# Patient Record
Sex: Male | Born: 1955 | Race: White | Hispanic: No | Marital: Single | Smoking: Former smoker
Health system: Southern US, Community
[De-identification: ages and names within clinical notes are randomized; demographics above are authoritative.]

## PROBLEM LIST (undated history)

## (undated) DIAGNOSIS — I2699 Other pulmonary embolism without acute cor pulmonale: Secondary | ICD-10-CM

## (undated) DIAGNOSIS — J189 Pneumonia, unspecified organism: Secondary | ICD-10-CM

## (undated) DIAGNOSIS — I10 Essential (primary) hypertension: Secondary | ICD-10-CM

## (undated) DIAGNOSIS — J449 Chronic obstructive pulmonary disease, unspecified: Secondary | ICD-10-CM

## (undated) DIAGNOSIS — I509 Heart failure, unspecified: Secondary | ICD-10-CM

## (undated) HISTORY — DX: Chronic obstructive pulmonary disease, unspecified: J44.9

## (undated) HISTORY — DX: Heart failure, unspecified: I50.9

## (undated) HISTORY — DX: Other pulmonary embolism without acute cor pulmonale: I26.99

---
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
## 2011-03-02 DIAGNOSIS — Z87891 Personal history of nicotine dependence: Secondary | ICD-10-CM | POA: Insufficient documentation

## 2018-04-04 ENCOUNTER — Encounter: Payer: Self-pay | Admitting: Emergency Medicine

## 2018-04-04 ENCOUNTER — Emergency Department (INDEPENDENT_AMBULATORY_CARE_PROVIDER_SITE_OTHER)
Admission: EM | Admit: 2018-04-04 | Discharge: 2018-04-04 | Disposition: A | Discharge status: Home / Self Care | Payer: Self-pay | Source: Home / Self Care | Attending: Family Medicine | Admitting: Family Medicine

## 2018-04-04 ENCOUNTER — Emergency Department (INDEPENDENT_AMBULATORY_CARE_PROVIDER_SITE_OTHER): Payer: Self-pay

## 2018-04-04 DIAGNOSIS — J189 Pneumonia, unspecified organism: Secondary | ICD-10-CM

## 2018-04-04 DIAGNOSIS — R918 Other nonspecific abnormal finding of lung field: Secondary | ICD-10-CM

## 2018-04-04 DIAGNOSIS — J181 Lobar pneumonia, unspecified organism: Secondary | ICD-10-CM

## 2018-04-04 HISTORY — DX: Essential (primary) hypertension: I10

## 2018-04-04 MED ORDER — IPRATROPIUM-ALBUTEROL 0.5-2.5 (3) MG/3ML IN SOLN: 3.0000 mL | Freq: Once | RESPIRATORY_TRACT | Status: DC

## 2018-04-04 MED ORDER — METHYLPREDNISOLONE SODIUM SUCC 40 MG IJ SOLR
80.0000 mg | Freq: Once | INTRAMUSCULAR | Status: AC
  Administered 2018-04-04: 80 mg via INTRAMUSCULAR

## 2018-04-04 MED ORDER — PREDNISONE 20 MG PO TABS: ORAL_TABLET | ORAL | 0 refills | Status: DC

## 2018-04-04 MED ORDER — CEFTRIAXONE SODIUM 1 G IJ SOLR
1.0000 g | Freq: Once | INTRAMUSCULAR | Status: AC
  Administered 2018-04-04: 1 g via INTRAMUSCULAR

## 2018-04-04 MED ORDER — LEVOFLOXACIN 500 MG PO TABS: 500.0000 mg | ORAL_TABLET | Freq: Every day | ORAL | 0 refills | Status: DC

## 2018-04-04 NOTE — Discharge Instructions (Signed)
°  Please take antibiotics as prescribed and be sure to complete entire course even if you start to feel better to ensure infection does not come back.  Please follow up with family medicine in 1 week if not improving, or call 911/go to the hospital if symptoms worsening including shortness of breath.  Please follow up with primary care on 04/15/18 as previously scheduled. It is recommended you get a repeat chest x-ray in 3-4 weeks to make sure the lung findings have cleared. There is some concern of possible lung mass/malignancy/cancer.

## 2018-04-04 NOTE — ED Provider Notes (Signed)
[facility]    CSN: [id number] Arrival date & time: 04/04/18  0941     History   Chief Complaint Chief Complaint  Patient presents with  . Cough    HPI Ronnie Bowman is a 62 y.o. male.   HPI Ronnie Bowman is a 62 y.o. male presenting to UC with c/o 1-2 weeks of worsening cough, congestion, chest tightness. He has taken 4 days of doxycycline, which were prescribed to his girlfriend last week who was started on Levaquin after the doxycycline was not helping her symptoms. Pt denies relief with the doxycycline.  He reports smoking for several years but quit about 6 months ago. He had been doing well and no SOB until last week.  Hx of possible COPD and CHF. He has been hospitalized before for breathing trouble but not for pneumonia.  He has inhalers at home but they do not provider much relief and he tries not to use them because he fears he will become dependant on them. Denies fever, chills, nvd.  BP elevated. Hx of HTN. He has been off his BP medication for a few weeks. He has a new patient appointment on 04/15/18.   Past Medical History:  Diagnosis Date  . Hypertension     There are no active problems to display for this patient.   History reviewed. No pertinent surgical history.     Home Medications    Prior to Admission medications   Medication Sig Start Date End Date Taking? Authorizing Provider  levofloxacin (LEVAQUIN) 500 MG tablet Take 1 tablet (500 mg total) by mouth daily. 04/04/18   Lurene Shadow, PA-C  predniSONE (DELTASONE) 20 MG tablet 3 tabs po day one, then 2 po daily x 4 days 04/04/18   Lurene Shadow, PA-C    Family History History reviewed. No pertinent family history.  Social History Social History   Tobacco Use  . Smoking status: Former Games developer  . Smokeless tobacco: Never Used  Substance Use Topics  . Alcohol use: Yes  . Drug use: Not on file     Allergies   Patient has no allergy information on record.   Review of Systems Review  of Systems  Constitutional: Positive for fatigue. Negative for chills and fever.  HENT: Positive for congestion. Negative for ear pain, sore throat, trouble swallowing and voice change.   Respiratory: Positive for cough, chest tightness, shortness of breath and wheezing.   Cardiovascular: Negative for chest pain and palpitations.  Gastrointestinal: Negative for abdominal pain, diarrhea, nausea and vomiting.  Musculoskeletal: Negative for arthralgias, back pain and myalgias.  Skin: Negative for rash.     Physical Exam Triage Vital Signs ED Triage Vitals  Enc Vitals Group     BP 04/04/18 1028 (!) 177/94     Pulse Rate 04/04/18 1028 60     Resp --      Temp 04/04/18 1028 97.8 F (36.6 C)     Temp Source 04/04/18 1028 Oral     SpO2 04/04/18 1028 92 %     Weight 04/04/18 1029 170 lb (77.1 kg)     Height --      Head Circumference --      Peak Flow --      Pain Score 04/04/18 1029 0     Pain Loc --      Pain Edu? --      Excl. in GC? --    No data found.  Updated Vital Signs BP (!) 177/94 (BP  Location: Right Arm)   Pulse 60   Temp 97.8 F (36.6 C) (Oral)   Wt 170 lb (77.1 kg)   SpO2 92%   Visual Acuity Right Eye Distance:   Left Eye Distance:   Bilateral Distance:    Right Eye Near:   Left Eye Near:    Bilateral Near:     Physical Exam  Constitutional: He is oriented to person, place, and time. He appears well-developed and well-nourished. No distress.  HENT:  Head: Normocephalic and atraumatic.  Right Ear: Tympanic membrane normal.  Left Ear: Tympanic membrane normal.  Nose: Nose normal. Right sinus exhibits no maxillary sinus tenderness and no frontal sinus tenderness. Left sinus exhibits no maxillary sinus tenderness and no frontal sinus tenderness.  Mouth/Throat: Uvula is midline, oropharynx is clear and moist and mucous membranes are normal.  Eyes: EOM are normal.  Neck: Normal range of motion. Neck supple.  Cardiovascular: Normal rate and regular rhythm.    Pulmonary/Chest: Effort normal. He has decreased breath sounds in the right middle field, the right lower field, the left middle field and the left lower field. He has wheezes. He has rhonchi. He has no rales.  Musculoskeletal: Normal range of motion.  Neurological: He is alert and oriented to person, place, and time.  Skin: Skin is warm and dry. He is not diaphoretic.  Psychiatric: He has a normal mood and affect. His behavior is normal.  Nursing note and vitals reviewed.    UC Treatments / Results  Labs (all labs ordered are listed, but only abnormal results are displayed) Labs Reviewed - No data to display  EKG None  Radiology Dg Chest 2 View  Result Date: 04/04/2018 CLINICAL DATA:  Patient with productive cough. EXAM: CHEST - 2 VIEW COMPARISON:  None. FINDINGS: Large area of consolidation within the left mid lung. Normal cardiac and mediastinal contours. No pleural effusion or pneumothorax. Thoracic spine degenerative changes. IMPRESSION: Large area of consolidation within the left mid lung may represent pneumonia in the appropriate clinical setting. Possibility of pulmonary mass is not excluded. Followup PA and lateral chest X-ray is recommended in 3-4 weeks following trial of antibiotic therapy to ensure resolution and exclude underlying malignancy. Electronically Signed   By: Annia Belt M.D.   On: 04/04/2018 10:56    Procedures Procedures (including critical care time)  Medications Ordered in UC Medications  ipratropium-albuterol (DUONEB) 0.5-2.5 (3) MG/3ML nebulizer solution 3 mL (has no administration in time range)  cefTRIAXone (ROCEPHIN) injection 1 g (1 g Intramuscular Given 04/04/18 1129)  methylPREDNISolone sodium succinate (SOLU-MEDROL) 40 mg/mL injection 80 mg (80 mg Intramuscular Given 04/04/18 1129)    Initial Impression / Assessment and Plan / UC Course  I have reviewed the triage vital signs and the nursing notes.  Pertinent labs & imaging results that were  available during my care of the patient were reviewed by me and considered in my medical decision making (see chart for details).     Diffuse wheeze and decreased lung sounds. CXR c/w pneumonia. Recommend f/u in 2 weeks for new pt appointment but also f/u in 3-4 weeks for repeat CXR to ensure clearance of lung findings after antibiotic treatment. Pt agreeable.  Final Clinical Impressions(s) / UC Diagnoses   Final diagnoses:  Community acquired pneumonia of left upper lobe of lung (HCC)     Discharge Instructions      Please take antibiotics as prescribed and be sure to complete entire course even if you start to feel better to  ensure infection does not come back.  Please follow up with family medicine in 1 week if not improving, or call 911/go to the hospital if symptoms worsening including shortness of breath.  Please follow up with primary care on 04/15/18 as previously scheduled. It is recommended you get a repeat chest x-ray in 3-4 weeks to make sure the lung findings have cleared. There is some concern of possible lung mass/malignancy/cancer.     ED Prescriptions    Medication Sig Dispense Auth. Provider   predniSONE (DELTASONE) 20 MG tablet 3 tabs po day one, then 2 po daily x 4 days 11 tablet Jeryl Wilbourn O, PA-C   levofloxacin (LEVAQUIN) 500 MG tablet Take 1 tablet (500 mg total) by mouth daily. 7 tablet Lurene Shadow, PA-C     Controlled Substance Prescriptions [place] Controlled Substance Registry consulted? Not Applicable   Rolla Plate 04/04/18 1209

## 2018-04-04 NOTE — ED Triage Notes (Signed)
Pt c/o head congestion and cough x1 week. States he has taken some of his girlfriends abx. He has been out of BP meds for a few weeks but states he has appt with pcp on 04/15/18.

## 2018-04-15 ENCOUNTER — Encounter: Payer: Self-pay | Admitting: Physician Assistant

## 2018-04-15 ENCOUNTER — Ambulatory Visit (INDEPENDENT_AMBULATORY_CARE_PROVIDER_SITE_OTHER): Payer: Self-pay | Admitting: Physician Assistant

## 2018-04-15 VITALS — BP 146/88 | HR 60 | Wt 167.0 lb

## 2018-04-15 DIAGNOSIS — Z7689 Persons encountering health services in other specified circumstances: Secondary | ICD-10-CM

## 2018-04-15 DIAGNOSIS — J449 Chronic obstructive pulmonary disease, unspecified: Secondary | ICD-10-CM | POA: Insufficient documentation

## 2018-04-15 DIAGNOSIS — I10 Essential (primary) hypertension: Secondary | ICD-10-CM

## 2018-04-15 DIAGNOSIS — I509 Heart failure, unspecified: Secondary | ICD-10-CM

## 2018-04-15 DIAGNOSIS — F5101 Primary insomnia: Secondary | ICD-10-CM | POA: Insufficient documentation

## 2018-04-15 DIAGNOSIS — Z86711 Personal history of pulmonary embolism: Secondary | ICD-10-CM

## 2018-04-15 DIAGNOSIS — J189 Pneumonia, unspecified organism: Secondary | ICD-10-CM

## 2018-04-15 DIAGNOSIS — Z7901 Long term (current) use of anticoagulants: Secondary | ICD-10-CM | POA: Insufficient documentation

## 2018-04-15 HISTORY — DX: Pneumonia, unspecified organism: J18.9

## 2018-04-15 MED ORDER — LISINOPRIL 20 MG PO TABS: 20.0000 mg | ORAL_TABLET | Freq: Every day | ORAL | 0 refills | Status: DC

## 2018-04-15 MED ORDER — CARVEDILOL 25 MG PO TABS: 25.0000 mg | ORAL_TABLET | Freq: Two times a day (BID) | ORAL | 0 refills | Status: DC

## 2018-04-15 MED ORDER — WARFARIN SODIUM 5 MG PO TABS: 5.0000 mg | ORAL_TABLET | Freq: Every day | ORAL | 0 refills | Status: DC

## 2018-04-15 MED ORDER — HYDROXYZINE HCL 50 MG PO TABS: 50.0000 mg | ORAL_TABLET | Freq: Every evening | ORAL | 0 refills | Status: DC | PRN

## 2018-04-15 NOTE — Patient Instructions (Addendum)
For your blood pressure: - Goal <130/80 - monitor and log blood pressures at home - check around the same time each day in a relaxed setting - Limit salt to <2000 mg/day - Follow DASH eating plan - limit alcohol to 2 standard drinks per day for men and 1 per day for women - avoid tobacco products - weight loss: 7% of current body weight - follow-up every 6 months for your blood pressure    Sleep Hygiene . Limiting daytime naps to 30 minutes . Napping does not make up for inadequate nighttime sleep. However, a short nap of 20-30 minutes can help to improve mood, alertness and performance.  . Avoiding stimulants such as  caffeine and nicotine close to bedtime.  And when it comes to alcohol, moderation is key 4. While alcohol is well-known to help you fall asleep faster, too much close to bedtime can disrupt sleep in the second half of the night as the body begins to process the alcohol.    . Exercising to promote good quality sleep.  As little as 10 minutes of aerobic exercise, such as walking or cycling, can drastically improve nighttime sleep quality.  For the best night's sleep, most people should avoid strenuous workouts close to bedtime. However, the effect of intense nighttime exercise on sleep differs from person to person, so find out what works best for you.   . Steering clear of food that can be disruptive right before sleep.   Heavy or rich foods, fatty or fried meals, spicy dishes, citrus fruits, and carbonated drinks can trigger indigestion for some people. When this occurs close to bedtime, it can lead to painful heartburn that disrupts sleep. . Ensuring adequate exposure to natural light.  This is particularly important for individuals who may not venture outside frequently. Exposure to sunlight during the day, as well as darkness at night, helps to maintain a healthy sleep-wake cycle . Marland Kitchen. Establishing a regular relaxing bedtime routine.  A regular nightly routine helps the body  recognize that it is bedtime. This could include taking warm shower or bath, reading a book, or light stretches. When possible, try to avoid emotionally upsetting conversations and activities before attempting to sleep. . Making sure that the sleep environment is pleasant.  Mattress and pillows should be comfortable. The bedroom should be cool - between 60 and 67 degrees - for optimal sleep. Bright light from lamps, cell phone and TV screens can make it difficult to fall asleep4, so turn those light off or adjust them when possible. Consider using blackout curtains, eye shades, ear plugs, "white noise" machines, humidifiers, fans and other devices that can make the bedroom more relaxing. . Meditation. YouTube Kristopher GleeMichael Sealy. There are many smartphone apps as well

## 2018-04-15 NOTE — Progress Notes (Signed)
HPI:                                                                Ronnie Bowman is a 62 y.o. male who presents to [facility] Primary Care Sports Medicine today to establish care  Current concerns:  Recently diagnosed with CAP on 04/04/18 in urgent care. CXR showed large consolidation in left mid lung  HTN/CHF: he is prescribed Carvedilol, Lisinopril and Amlodipine. He is not compliant with medications due to financial difficulties and running out of refills. Does not check BP's at home. States at one time he was told he had heart failure, but he does not have his medical records. Denies vision change, headache, chest pain with exertion, orthopnea, lightheadedness, syncope and edema. Risk factors include: tobacco use, male sex  Hx of PE: Has not taken warfarin for 3 weeks. He is supposed to take 5 mg daily. Diagnosed with acute PE in April 2019.  COPD: reports inhalers don't do anything.  Reports history of 3 hospitalizations in 3 years related to his COPD.  Insomnia: reports he has difficulty falling asleep. Usual bedtime 9:30-10pm, takes 1 hour to fall asleep, wakes up at 4 am He has taken Ambien and Xanax in the past.  Depression screen [facility] 2/9 04/15/2018  Decreased Interest 0  Down, Depressed, Hopeless 1  PHQ - 2 Score 1    GAD 7 : Generalized Anxiety Score 04/15/2018  Nervous, Anxious, on Edge 3  Control/stop worrying 1  Worry too much - different things 1  Trouble relaxing 3  Restless 0  Easily annoyed or irritable 0  Afraid - awful might happen 0  Total GAD 7 Score 8  Anxiety Difficulty Not difficult at all      Past Medical History:  Diagnosis Date  . Acute pulmonary embolism (HCC)   . CHF (congestive heart failure) (HCC)   . COPD (chronic obstructive pulmonary disease) (HCC)   . Hypertension    History reviewed. No pertinent surgical history. Social History   Tobacco Use  . Smoking status: Former Smoker    Packs/day: 2.00    Years:  45.00    Pack years: 90.00    Types: Cigarettes    Last attempt to quit: 11/01/2017    Years since quitting: 0.4  . Smokeless tobacco: Never Used  Substance Use Topics  . Alcohol use: Yes    Alcohol/week: 3.0 standard drinks    Types: 3 Standard drinks or equivalent per week   family history includes Heart failure in his father; Leukemia in his mother.    ROS: negative except as noted in the HPI  Medications: Current Outpatient Medications  Medication Sig Dispense Refill  . carvedilol (COREG) 25 MG tablet Take 1 tablet (25 mg total) by mouth 2 (two) times daily with a meal. 60 tablet 0  . lisinopril (PRINIVIL,ZESTRIL) 20 MG tablet Take 1 tablet (20 mg total) by mouth daily. 30 tablet 0  . hydrOXYzine (ATARAX/VISTARIL) 50 MG tablet Take 1 tablet (50 mg total) by mouth at bedtime as needed for anxiety (sleep). 30 tablet 0  . warfarin (COUMADIN) 5 MG tablet Take 1 tablet (5 mg total) by mouth at bedtime. 30 tablet 0   No current facility-administered medications for this visit.  No Known Allergies     Objective:  BP (!) 146/88   Pulse 60   Wt 167 lb (75.8 kg)  Gen:  alert, not ill-appearing, no distress, appropriate for age HEENT: head normocephalic without obvious abnormality, conjunctiva and cornea clear, trachea midline Pulm: Normal work of breathing, normal phonation, breath sounds diffusely diminished, end-expiratory wheezes CV: Normal rate, regular rhythm, s1 and s2 distinct, no murmurs, clicks or rubs  Neuro: alert and oriented x 3, no tremor MSK: extremities atraumatic, normal gait and station Skin: intact, no rashes on exposed skin, no jaundice, no cyanosis Psych: well-groomed, cooperative, good eye contact, euthymic mood, affect mood-congruent, speech is articulate, and thought processes clear and goal-directed    No results found for this or any previous visit (from the past 72 hour(s)). No results found.    Assessment and Plan: 62 y.o. male with    .[name] was seen today for establish care.  Diagnoses and all orders for this visit:  Encounter to establish care  Chronic anticoagulation -     warfarin (COUMADIN) 5 MG tablet; Take 1 tablet (5 mg total) by mouth at bedtime.  Hypertension goal BP (blood pressure) < 130/80 -     Basic metabolic panel -     carvedilol (COREG) 25 MG tablet; Take 1 tablet (25 mg total) by mouth 2 (two) times daily with a meal.  History of pulmonary embolism Comments: April 2019 Orders: -     warfarin (COUMADIN) 5 MG tablet; Take 1 tablet (5 mg total) by mouth at bedtime.  Chronic obstructive pulmonary disease, unspecified COPD type (HCC)  Primary insomnia -     hydrOXYzine (ATARAX/VISTARIL) 50 MG tablet; Take 1 tablet (50 mg total) by mouth at bedtime as needed for anxiety (sleep).  Community acquired pneumonia of left lung, unspecified part of lung -     CBC with Differential/Platelet  Congestive heart failure, unspecified HF chronicity, unspecified heart failure type (HCC) -     carvedilol (COREG) 25 MG tablet; Take 1 tablet (25 mg total) by mouth 2 (two) times daily with a meal. -     lisinopril (PRINIVIL,ZESTRIL) 20 MG tablet; Take 1 tablet (20 mg total) by mouth daily.   - Personally reviewed PMH, PSH, PFH, medications, allergies, HM - Age-appropriate cancer screening: cannot afford colon cancer screening or lung cancer screening at this time - Influenza declined - Tdap declined - PHQ2 negative  He is self-pay. Unable to afford comprehensive labs, echocardiogram, colon cancer screening, or CT chest for lung cancer screening. He was provided with information on medication assistance programs [place].  CBC w/diff and BMP pending Due to his risk factors, I would recommend repeating CXR 8 weeks from diagnosis of CAP to ensure resolution of the infiltrate   Patient education and anticipatory guidance given Patient agrees with treatment plan Follow-up in 2 weeks for nurse visit INR/BP  check or sooner as needed if symptoms worsen or fail to improve  Levonne Hubertharley E. Kemaria Dedic PA-C

## 2018-04-22 ENCOUNTER — Encounter: Payer: Self-pay | Admitting: Physician Assistant

## 2018-04-24 ENCOUNTER — Encounter: Payer: Self-pay | Admitting: Physician Assistant

## 2018-04-26 MED ORDER — AMLODIPINE BESYLATE 5 MG PO TABS: 5.0000 mg | ORAL_TABLET | Freq: Every day | ORAL | 1 refills | Status: DC

## 2018-04-26 NOTE — Addendum Note (Signed)
Addended by: Gena FrayUMMINGS, Alison Breeding E on: 04/26/2018 04:57 PM   Modules accepted: Orders

## 2018-04-29 ENCOUNTER — Ambulatory Visit (INDEPENDENT_AMBULATORY_CARE_PROVIDER_SITE_OTHER): Payer: Self-pay | Admitting: Physician Assistant

## 2018-04-29 DIAGNOSIS — Z86711 Personal history of pulmonary embolism: Secondary | ICD-10-CM

## 2018-04-29 DIAGNOSIS — Z7901 Long term (current) use of anticoagulants: Secondary | ICD-10-CM

## 2018-04-29 LAB — POCT INR: INR: 1.2 — AB (ref 2–3)

## 2018-04-29 NOTE — Progress Notes (Signed)
   Subjective:    Patient ID: Ronnie Bowman, male    DOB: Jul 02, 1956, 62 y.o.   MRN: [id number]  HPI Pt in office today for recheck of BP and INR check. No complaints of bruising, excessive bleeding. Just took BP med 45 minutes ago.  Patient advised me that he can not come back in 4 days to have INR rechecked, I asked him when he could and he said he could'nt he had to work. Advised patient the importance of needing to have this checked so he does not have another boold clot and patient still said " I can't come back and don't know when I will be able to come back". Advised patient to increase his Coumadin on Fridays only to 1 1/2 tabs and jus Coumadin 5mg  tab all other days and to recheck in 4 days.   Review of Systems     Objective:   Physical Exam        Assessment & Plan:  Advised pt to return in 4 days to recheck INR.

## 2018-05-18 ENCOUNTER — Other Ambulatory Visit: Payer: Self-pay | Admitting: Physician Assistant

## 2018-05-18 DIAGNOSIS — I10 Essential (primary) hypertension: Secondary | ICD-10-CM

## 2018-05-18 DIAGNOSIS — I509 Heart failure, unspecified: Secondary | ICD-10-CM

## 2018-05-18 DIAGNOSIS — Z7901 Long term (current) use of anticoagulants: Secondary | ICD-10-CM

## 2018-05-18 DIAGNOSIS — Z86711 Personal history of pulmonary embolism: Secondary | ICD-10-CM

## 2018-05-18 DIAGNOSIS — F5101 Primary insomnia: Secondary | ICD-10-CM

## 2018-05-20 MED ORDER — HYDROXYZINE HCL 50 MG PO TABS: 50.0000 mg | ORAL_TABLET | Freq: Every evening | ORAL | 0 refills | Status: DC | PRN

## 2018-05-20 MED ORDER — WARFARIN SODIUM 5 MG PO TABS: ORAL_TABLET | ORAL | 0 refills | Status: DC

## 2018-05-20 MED ORDER — CARVEDILOL 25 MG PO TABS: 25.0000 mg | ORAL_TABLET | Freq: Two times a day (BID) | ORAL | 0 refills | Status: DC

## 2018-05-20 NOTE — Telephone Encounter (Signed)
Ronnie Bowman, this is the gentleman who said he was not gonna come back in because he could not afford it. Do you want to refill?

## 2018-05-20 NOTE — Telephone Encounter (Signed)
He needs to meet [place] halfway and at a minimum have his INR checked every 4 weeks. It is too risky to take this medication without routine lab monitoring as it can lead to potentially fatal bleeding. I have refilled 1 week of Coumadin, but he needs to come in next week for INR

## 2018-05-27 NOTE — Telephone Encounter (Signed)
Called patient and notified him of PA instructions. Sent him to front desk to find out how it cost for nurse visit. KG LPN

## 2018-06-12 ENCOUNTER — Other Ambulatory Visit: Payer: Self-pay | Admitting: Physician Assistant

## 2018-06-12 DIAGNOSIS — I509 Heart failure, unspecified: Secondary | ICD-10-CM

## 2018-06-27 ENCOUNTER — Other Ambulatory Visit: Payer: Self-pay | Admitting: Physician Assistant

## 2018-06-27 DIAGNOSIS — F5101 Primary insomnia: Secondary | ICD-10-CM

## 2018-06-27 DIAGNOSIS — I509 Heart failure, unspecified: Secondary | ICD-10-CM

## 2018-06-27 DIAGNOSIS — I10 Essential (primary) hypertension: Secondary | ICD-10-CM

## 2018-07-28 ENCOUNTER — Other Ambulatory Visit: Payer: Self-pay | Admitting: Physician Assistant

## 2018-07-28 DIAGNOSIS — I509 Heart failure, unspecified: Secondary | ICD-10-CM

## 2018-07-28 DIAGNOSIS — F5101 Primary insomnia: Secondary | ICD-10-CM

## 2018-12-19 ENCOUNTER — Telehealth: Payer: Self-pay | Admitting: Physician Assistant

## 2018-12-19 NOTE — Telephone Encounter (Signed)
FYI: I called patient to reschedule his apptt(he was on break) Pt has scheduled an appointment for May 21st but he also reported that his bp was running 200/140(hasn't been on meds for months) because he no longer has insurance also behind on INR checks.Thanks

## 2018-12-21 ENCOUNTER — Other Ambulatory Visit: Payer: Self-pay | Admitting: Physician Assistant

## 2018-12-21 DIAGNOSIS — D892 Hypergammaglobulinemia, unspecified: Secondary | ICD-10-CM

## 2018-12-21 DIAGNOSIS — E8809 Other disorders of plasma-protein metabolism, not elsewhere classified: Secondary | ICD-10-CM

## 2018-12-21 NOTE — Telephone Encounter (Signed)
Left VM to check on Pt, advised him to return clinic call.

## 2018-12-22 ENCOUNTER — Ambulatory Visit (INDEPENDENT_AMBULATORY_CARE_PROVIDER_SITE_OTHER): Payer: Self-pay | Admitting: Physician Assistant

## 2018-12-22 ENCOUNTER — Other Ambulatory Visit: Payer: Self-pay

## 2018-12-22 ENCOUNTER — Encounter: Payer: Self-pay | Admitting: Physician Assistant

## 2018-12-22 VITALS — BP 232/135 | HR 75 | Temp 98.2°F | Ht 69.0 in | Wt 198.0 lb

## 2018-12-22 DIAGNOSIS — J449 Chronic obstructive pulmonary disease, unspecified: Secondary | ICD-10-CM

## 2018-12-22 DIAGNOSIS — Z599 Problem related to housing and economic circumstances, unspecified: Secondary | ICD-10-CM

## 2018-12-22 DIAGNOSIS — Z86711 Personal history of pulmonary embolism: Secondary | ICD-10-CM

## 2018-12-22 DIAGNOSIS — Z7901 Long term (current) use of anticoagulants: Secondary | ICD-10-CM

## 2018-12-22 DIAGNOSIS — Z598 Other problems related to housing and economic circumstances: Secondary | ICD-10-CM

## 2018-12-22 DIAGNOSIS — I509 Heart failure, unspecified: Secondary | ICD-10-CM

## 2018-12-22 DIAGNOSIS — R0602 Shortness of breath: Secondary | ICD-10-CM

## 2018-12-22 DIAGNOSIS — Z91148 Patient's other noncompliance with medication regimen for other reason: Secondary | ICD-10-CM

## 2018-12-22 DIAGNOSIS — I10 Essential (primary) hypertension: Secondary | ICD-10-CM

## 2018-12-22 DIAGNOSIS — R0789 Other chest pain: Secondary | ICD-10-CM

## 2018-12-22 DIAGNOSIS — Z9114 Patient's other noncompliance with medication regimen: Secondary | ICD-10-CM

## 2018-12-22 MED ORDER — WARFARIN SODIUM 5 MG PO TABS: ORAL_TABLET | ORAL | 0 refills | Status: DC

## 2018-12-22 MED ORDER — AMLODIPINE BESYLATE 5 MG PO TABS: 5.0000 mg | ORAL_TABLET | Freq: Every day | ORAL | 0 refills | Status: DC

## 2018-12-22 MED ORDER — BUDESONIDE-FORMOTEROL FUMARATE 160-4.5 MCG/ACT IN AERO: 2.0000 | INHALATION_SPRAY | Freq: Two times a day (BID) | RESPIRATORY_TRACT | 3 refills | Status: DC

## 2018-12-22 MED ORDER — LISINOPRIL 20 MG PO TABS: 20.0000 mg | ORAL_TABLET | Freq: Every day | ORAL | 0 refills | Status: DC

## 2018-12-22 MED ORDER — ENOXAPARIN SODIUM 40 MG/0.4ML ~~LOC~~ SOLN: 40.0000 mg | Freq: Every day | SUBCUTANEOUS | 0 refills | Status: DC

## 2018-12-22 MED ORDER — ATORVASTATIN CALCIUM 20 MG PO TABS: 20.0000 mg | ORAL_TABLET | Freq: Every day | ORAL | 0 refills | Status: DC

## 2018-12-22 MED ORDER — CARVEDILOL 25 MG PO TABS: 25.0000 mg | ORAL_TABLET | Freq: Two times a day (BID) | ORAL | 0 refills | Status: DC

## 2018-12-22 NOTE — Patient Instructions (Addendum)
Blood Thinner Instructions Start Lovenox. Inject 40 mg once daily  On day 3 of Lovenox start Coumadin 5 mg daily Continue both medications and follow-up in our office for INR in 1 week Do not take any aspirin products or anti-inflammatories like ibuprofen, naproxen, aleve, etc  For SOB Start Symbicort 2 puffs twice a day. Use before brushing teeth and rinse mouth out well   Go to the emergency room for persistent chest pain, worsening shortness of breath, worsening headache  Enoxaparin injection What is this medicine? ENOXAPARIN (ee nox a PA rin) is used after knee, hip, or abdominal surgeries to prevent blood clotting. It is also used to treat existing blood clots in the lungs or in the veins. This medicine may be used for other purposes; ask your health care provider or pharmacist if you have questions. COMMON BRAND NAME(S): Lovenox What should I tell my health care provider before I take this medicine? They need to know if you have any of these conditions: -bleeding disorders, hemorrhage, or hemophilia -infection of the heart or heart valves -kidney or liver disease -previous stroke -prosthetic heart valve -recent surgery or delivery of a baby -ulcer in the stomach or intestine, diverticulitis, or other bowel disease -an unusual or allergic reaction to enoxaparin, heparin, pork or pork products, other medicines, foods, dyes, or preservatives -pregnant or trying to get pregnant -breast-feeding How should I use this medicine? This medicine is for injection under the skin. It is usually given by a health-care professional. You or a family member may be trained on how to give the injections. If you are to give yourself injections, make sure you understand how to use the syringe, measure the dose if necessary, and give the injection. To avoid bruising, do not rub the site where this medicine has been injected. Do not take your medicine more often than directed. Do not stop taking except on  the advice of your doctor or health care professional. Make sure you receive a puncture-resistant container to dispose of the needles and syringes once you have finished with them. Do not reuse these items. Return the container to your doctor or health care professional for proper disposal. Talk to your pediatrician regarding the use of this medicine in children. Special care may be needed. Overdosage: If you think you have taken too much of this medicine contact a poison control center or emergency room at once. NOTE: This medicine is only for you. Do not share this medicine with others. What if I miss a dose? If you miss a dose, take it as soon as you can. If it is almost time for your next dose, take only that dose. Do not take double or extra doses. What may interact with this medicine? -aspirin and aspirin-like medicines -certain medicines that treat or prevent blood clots -dipyridamole -NSAIDs, medicines for pain and inflammation, like ibuprofen or naproxen This list may not describe all possible interactions. Give your health care provider a list of all the medicines, herbs, non-prescription drugs, or dietary supplements you use. Also tell them if you smoke, drink alcohol, or use illegal drugs. Some items may interact with your medicine. What should I watch for while using this medicine? Visit your healthcare professional for regular checks on your progress. You may need blood work done while you are taking this medicine. Your condition will be monitored carefully while you are receiving this medicine. It is important not to miss any appointments. If you are going to need surgery or other procedure,  tell your healthcare professional that you are using this medicine. Using this medicine for a long time may weaken your bones and increase the risk of bone fractures. Avoid sports and activities that might cause injury while you are using this medicine. Severe falls or injuries can cause unseen  bleeding. Be careful when using sharp tools or knives. Consider using an Neurosurgeon. Take special care brushing or flossing your teeth. Report any injuries, bruising, or red spots on the skin to your healthcare professional. Wear a medical ID bracelet or chain. Carry a card that describes your disease and details of your medicine and dosage times. What side effects may I notice from receiving this medicine? Side effects that you should report to your doctor or health care professional as soon as possible: -allergic reactions like skin rash, itching or hives, swelling of the face, lips, or tongue -bone pain -signs and symptoms of bleeding such as bloody or black, tarry stools; red or dark-brown urine; spitting up blood or brown material that looks like coffee grounds; red spots on the skin; unusual bruising or bleeding from the eye, gums, or nose -signs and symptoms of a blood clot such as chest pain; shortness of breath; pain, swelling, or warmth in the leg -signs and symptoms of a stroke such as changes in vision; confusion; trouble speaking or understanding; severe headaches; sudden numbness or weakness of the face, arm or leg; trouble walking; dizziness; loss of coordination Side effects that usually do not require medical attention (report to your doctor or health care professional if they continue or are bothersome): -hair loss -pain, redness, or irritation at site where injected This list may not describe all possible side effects. Call your doctor for medical advice about side effects. You may report side effects to FDA at 1-800-FDA-1088. Where should I keep my medicine? Keep out of the reach of children. Store at room temperature between 15 and 30 degrees C (59 and 86 degrees F). Do not freeze. If your injections have been specially prepared, you may need to store them in the refrigerator. Ask your pharmacist. Throw away any unused medicine after the expiration date. NOTE: This sheet is a  summary. It may not cover all possible information. If you have questions about this medicine, talk to your doctor, pharmacist, or health care provider.  2019 Elsevier/Gold Standard (2017-07-15 11:25:34)   Nonspecific Chest Pain, Adult Chest pain can be caused by many different conditions. It can be caused by a condition that is life-threatening and requires treatment right away. It can also be caused by something that is not life-threatening. If you have chest pain, it can be hard to know the difference, so it is important to get help right away to make sure that you do not have a serious condition. Some life-threatening causes of chest pain include:  Heart attack.  A tear in the body's main blood vessel (aortic dissection).  Inflammation around your heart (pericarditis).  A problem in the lungs, such as a blood clot (pulmonary embolism) or a collapsed lung (pneumothorax). Some non life-threatening causes of chest pain include:  Heartburn.  Anxiety or stress.  Damage to the bones, muscles, and cartilage that make up your chest wall.  Pneumonia or bronchitis.  Shingles infection (varicella-zoster virus). Chest pain can feel like:  Pain or discomfort on the surface of your chest or deep in your chest.  Crushing, pressure, aching, or squeezing pain.  Burning or tingling.  Dull or sharp pain that is worse when  you move, cough, or take a deep breath.  Pain or discomfort that is also felt in your back, neck, jaw, shoulder, or arm, or pain that spreads to any of these areas. Your chest pain may come and go. It may also be constant. Your health care provider will do lab tests and other studies to find the cause of your pain. Treatment will depend on the cause of your chest pain. Follow these instructions at home: Medicines  Take over-the-counter and prescription medicines only as told by your health care provider.  If you were prescribed an antibiotic, take it as told by your  health care provider. Do not stop taking the antibiotic even if you start to feel better. Lifestyle   Rest as directed by your health care provider.  Do not use any products that contain nicotine or tobacco, such as cigarettes and e-cigarettes. If you need help quitting, ask your health care provider.  Do not drink alcohol.  Make healthy lifestyle choices as recommended. These may include: ? Getting regular exercise. Ask your health care provider to suggest some activities that are safe for you. ? Eating a heart-healthy diet. This includes plenty of fresh fruits and vegetables, whole grains, low-fat (lean) protein, and low-fat dairy products. A dietitian can help you find healthy eating options. ? Maintaining a healthy weight. ? Managing any other health conditions you have, such as high blood pressure (hypertension) or diabetes. ? Reducing stress, such as with yoga or relaxation techniques. General instructions  Pay attention to any changes in your symptoms. Tell your health care provider about them or any new symptoms.  Avoid any activities that cause chest pain.  Keep all follow-up visits as told by your health care provider. This is important. This includes visits for any further testing if your chest pain does not go away. Contact a health care provider if:  Your chest pain does not go away.  You feel depressed.  You have a fever. Get help right away if:  Your chest pain gets worse.  You have a cough that gets worse, or you cough up blood.  You have severe pain in your abdomen.  You faint.  You have sudden, unexplained chest discomfort.  You have sudden, unexplained discomfort in your arms, back, neck, or jaw.  You have shortness of breath at any time.  You suddenly start to sweat, or your skin gets clammy.  You feel nausea or you vomit.  You suddenly feel lightheaded or dizzy.  You have severe weakness, or unexplained weakness or fatigue.  Your heart begins  to beat quickly, or it feels like it is skipping beats. These symptoms may represent a serious problem that is an emergency. Do not wait to see if the symptoms will go away. Get medical help right away. Call your local emergency services (911 in the [place] Do not drive yourself to the hospital. Summary  Chest pain can be caused by a condition that is serious and requires urgent treatment. It may also be caused by something that is not life-threatening.  If you have chest pain, it is very important to see your health care provider. Your health care provider may do lab tests and other studies to find the cause of your pain.  Follow your health care provider's instructions on taking medicines, making lifestyle changes, and getting emergency treatment if symptoms become worse.  Keep all follow-up visits as told by your health care provider. This includes visits for any further testing if your  chest pain does not go away. This information is not intended to replace advice given to you by your health care provider. Make sure you discuss any questions you have with your health care provider. Document Released: 04/29/2005 Document Revised: 01/20/2018 Document Reviewed: 01/20/2018 Elsevier Interactive Patient Education  2019 [employer].

## 2018-12-22 NOTE — Progress Notes (Signed)
ekg 

## 2018-12-22 NOTE — Progress Notes (Signed)
HPI:                                                                Ronnie Bowman is a 63 y.o. male who presents to [facility] today for high blood pressure  63 yo M with PMH of CHF, HTN, COPD, hx of PE. Patient self-discontinued his medication approx 5 months ago due to cost and has been lost to follow-up  Reports his BP has been high because he has been having intermittent headaches. He denies headache today. Denies vision change, focal weakness, dizziness.  Has been having intermittent SOB and chest "tightness" with exertion for several months, namely climbing 21 stairs at his apartment building. Former smoker, 90 packyears. Denies orthopnea, PND, edema. Denies chest pain today.   Past Medical History:  Diagnosis Date  . Acute pulmonary embolism (HCC)   . CHF (congestive heart failure) (HCC)   . COPD (chronic obstructive pulmonary disease) (HCC)   . Hypertension    History reviewed. No pertinent surgical history. Social History   Tobacco Use  . Smoking status: Former Smoker    Packs/day: 2.00    Years: 45.00    Pack years: 90.00    Types: Cigarettes    Last attempt to quit: 11/01/2017    Years since quitting: 1.1  . Smokeless tobacco: Never Used  Substance Use Topics  . Alcohol use: Yes    Alcohol/week: 3.0 standard drinks    Types: 3 Standard drinks or equivalent per week   family history includes Heart failure in his father; Leukemia in his mother.    ROS: negative except as noted in the HPI  Medications: Current Outpatient Medications  Medication Sig Dispense Refill  . amLODipine (NORVASC) 5 MG tablet Take 1 tablet (5 mg total) by mouth daily. 90 tablet 0  . atorvastatin (LIPITOR) 20 MG tablet Take 1 tablet (20 mg total) by mouth at bedtime. 90 tablet 0  . carvedilol (COREG) 25 MG tablet Take 1 tablet (25 mg total) by mouth 2 (two) times daily with a meal. 180 tablet 0  . enoxaparin (LOVENOX) 40 MG/0.4ML injection  Inject 0.4 mLs (40 mg total) into the skin daily for 7 days. 2.8 mL 0  . lisinopril (ZESTRIL) 20 MG tablet Take 1 tablet (20 mg total) by mouth daily. 90 tablet 0  . [START ON 12/25/2018] warfarin (COUMADIN) 5 MG tablet 1.5 tabs PO on Fridays, 1 tab PO all other days 30 tablet 0   No current facility-administered medications for this visit.    No Known Allergies     Objective:  BP (!) 232/135   Pulse 75   Temp 98.2 F (36.8 C) (Oral)   Ht  (1.753 m)   Wt 198 lb (89.8 kg)   SpO2 95%   BMI 29.24 kg/m  Vitals:   12/22/18 1400 12/22/18 1449  BP: (!) 222/110 (!) 232/135  Pulse: 77 75  Temp: 98.2 F (36.8 C)   SpO2: 91% 95%    Gen:  alert, not ill-appearing, no distress, appropriate for age HEENT: head normocephalic without obvious abnormality, conjunctiva and cornea clear, trachea midline Pulm: Normal work of breathing, normal phonation, poor air movement, no wheezes, rales or rhonchi CV: Normal rate, regular rhythm, s1  and s2 distinct, no murmurs, clicks or rubs  Neuro: alert and oriented x 3, no tremor MSK: extremities atraumatic, normal gait and station, trace peripheral edema Skin: intact, no rashes on exposed skin, no jaundice, no cyanosis Psych: well-groomed, cooperative, good eye contact, euthymic mood, affect mood-congruent, speech is articulate, and thought processes clear and goal-directed   BP Readings from Last 3 Encounters:  12/22/18 (!) 232/135  04/29/18 (!) 145/69  04/15/18 (!) 146/88    No results found for: CREATININE, BUN, NA, K, CL, CO2  ECG 12/22/2018 14:10 Vent rate 75 bpm Pr-I 142 ms QRS 82 ms QT/QTc 402/448 ms NSR, minimal voltage criteria for LVH, borderline ECG  Assessment and Plan: 63 y.o. male with   .Ronnie Bowman was seen today for hypertension.  Diagnoses and all orders for this visit:  Chest tightness -     EKG 12-Lead  SOB (shortness of breath) -     EKG 12-Lead -     CBC -     Discontinue: budesonide-formoterol (SYMBICORT)  160-4.5 MCG/ACT inhaler; Inhale 2 puffs into the lungs 2 (two) times daily.  Essential hypertension -     EKG 12-Lead  Uncontrolled stage 2 hypertension -     CBC -     COMPLETE METABOLIC PANEL WITH GFR -     TSH + free T4 -     carvedilol (COREG) 25 MG tablet; Take 1 tablet (25 mg total) by mouth 2 (two) times daily with a meal. -     Discontinue: amLODipine (NORVASC) 5 MG tablet; Take 1 tablet (5 mg total) by mouth daily. -     Discontinue: lisinopril (ZESTRIL) 20 MG tablet; Take 1 tablet (20 mg total) by mouth daily.  Hypertension goal BP (blood pressure) < 130/80  Congestive heart failure, unspecified HF chronicity, unspecified heart failure type (HCC) -     carvedilol (COREG) 25 MG tablet; Take 1 tablet (25 mg total) by mouth 2 (two) times daily with a meal. -     Discontinue: lisinopril (ZESTRIL) 20 MG tablet; Take 1 tablet (20 mg total) by mouth daily. -     atorvastatin (LIPITOR) 20 MG tablet; Take 1 tablet (20 mg total) by mouth at bedtime.  History of pulmonary embolism -     Discontinue: enoxaparin (LOVENOX) 40 MG/0.4ML injection; Inject 0.4 mLs (40 mg total) into the skin daily for 7 days. -     Discontinue: warfarin (COUMADIN) 5 MG tablet; 1.5 tabs PO on Fridays, 1 tab PO all other days -     POCT INR  Chronic anticoagulation -     Discontinue: enoxaparin (LOVENOX) 40 MG/0.4ML injection; Inject 0.4 mLs (40 mg total) into the skin daily for 7 days. -     Discontinue: warfarin (COUMADIN) 5 MG tablet; 1.5 tabs PO on Fridays, 1 tab PO all other days -     POCT INR  History of pulmonary embolism Comments: April 2019 Orders: -     Discontinue: enoxaparin (LOVENOX) 40 MG/0.4ML injection; Inject 0.4 mLs (40 mg total) into the skin daily for 7 days. -     Discontinue: warfarin (COUMADIN) 5 MG tablet; 1.5 tabs PO on Fridays, 1 tab PO all other days -     POCT INR  Chronic obstructive pulmonary disease, unspecified COPD type (HCC) -     Discontinue: budesonide-formoterol  (SYMBICORT) 160-4.5 MCG/ACT inhaler; Inhale 2 puffs into the lungs 2 (two) times daily.     Uncontrolled HTN 2/2 noncompliance with medications and financial diffculty Currently  asymptomatic, renal function pending Patient has been having intermittent headaches. No headache today. Denies chest pain ECG personally reviewed by me and Dr. Lyn HollingsheadAlexander. There was no prior ECG for comparison. No evidence of STEMI. There is evidence of LVH ER precautions reviewed with patient including symptoms of hypertensive emergency, stroke and ACS  Hx of PE Previously on chronic anticoagulation. Self-discontinued Warfarin 4-5 months ago Starting Warfarin 5 mg QD with Lovenox bridge Renal fx pending  SOB, COPD CBC pending Declined Symbicort samples Reports he has multiple inhalers at home and they do not help him  Patient education and anticipatory guidance given Patient agrees with treatment plan Follow-up in 1 week for nurse INR/BP check or sooner as needed if symptoms worsen or fail to improve  Levonne Hubertharley E. Cummings PA-C

## 2018-12-23 ENCOUNTER — Telehealth: Payer: Self-pay | Admitting: Physician Assistant

## 2018-12-23 ENCOUNTER — Encounter: Payer: Self-pay | Admitting: Physician Assistant

## 2018-12-23 ENCOUNTER — Ambulatory Visit: Payer: Self-pay | Admitting: Physician Assistant

## 2018-12-23 NOTE — Telephone Encounter (Signed)
Called [facility] at [phone] with acct# [id number] to request labs on Ronnie Bowman that were drwn and taken downstairs to [facility] 12/22/2018. Working with PA with phone visits as well. Called and was put on hold 3 times so called and went through automated system and system stated labs for patient were there so I requested lab results be faxed to [phone] automated machine verified. Called Ronnie Bowman in office to let her know labs would be faxed from [facility] on patient, once received to email to Ronnie Bowman confirmed would send when fax came through. As of 4:03 no labs received yet.

## 2018-12-27 DIAGNOSIS — R771 Abnormality of globulin: Secondary | ICD-10-CM | POA: Insufficient documentation

## 2018-12-27 DIAGNOSIS — E8809 Other disorders of plasma-protein metabolism, not elsewhere classified: Secondary | ICD-10-CM | POA: Insufficient documentation

## 2018-12-27 LAB — COMPLETE METABOLIC PANEL WITH GFR
AG Ratio: 0.9 (calc) — ABNORMAL LOW (ref 1.0–2.5)
ALT: 30 U/L (ref 9–46)
AST: 21 U/L (ref 10–35)
Albumin: 3.6 g/dL (ref 3.6–5.1)
Alkaline phosphatase (APISO): 63 U/L (ref 35–144)
BUN: 17 mg/dL (ref 7–25)
CO2: 30 mmol/L (ref 20–32)
Calcium: 8.8 mg/dL (ref 8.6–10.3)
Chloride: 101 mmol/L (ref 98–110)
Creat: 0.87 mg/dL (ref 0.70–1.25)
GFR, Est African American: 109 mL/min/{1.73_m2} (ref >=60)
GFR, Est Non African American: 94 mL/min/{1.73_m2} (ref >=60)
Globulin: 4.2 g/dL (calc) — ABNORMAL HIGH (ref 1.9–3.7)
Glucose, Bld: 105 mg/dL — ABNORMAL HIGH (ref 65–99)
Potassium: 4.7 mmol/L (ref 3.5–5.3)
Sodium: 136 mmol/L (ref 135–146)
Total Bilirubin: 1.1 mg/dL (ref 0.2–1.2)
Total Protein: 7.8 g/dL (ref 6.1–8.1)

## 2018-12-27 LAB — CBC
HCT: 48 % (ref 38.5–50.0)
Hemoglobin: 16.8 g/dL (ref 13.2–17.1)
MCH: 33.9 pg — ABNORMAL HIGH (ref 27.0–33.0)
MCHC: 35 g/dL (ref 32.0–36.0)
MCV: 97 fL (ref 80.0–100.0)
MPV: 11.7 fL (ref 7.5–12.5)
Platelets: 220 10*3/uL (ref 140–400)
RBC: 4.95 10*6/uL (ref 4.20–5.80)
RDW: 14.2 % (ref 11.0–15.0)
WBC: 8.1 10*3/uL (ref 3.8–10.8)

## 2018-12-27 LAB — HOUSE ACCOUNT TRACKING

## 2018-12-27 NOTE — Addendum Note (Signed)
Addended by: Gena Fray E on: 12/27/2018 09:51 PM   Modules accepted: Orders

## 2018-12-30 ENCOUNTER — Other Ambulatory Visit: Payer: Self-pay | Admitting: Physician Assistant

## 2018-12-30 ENCOUNTER — Ambulatory Visit (INDEPENDENT_AMBULATORY_CARE_PROVIDER_SITE_OTHER): Payer: Self-pay | Admitting: Physician Assistant

## 2018-12-30 DIAGNOSIS — Z7901 Long term (current) use of anticoagulants: Secondary | ICD-10-CM

## 2018-12-30 DIAGNOSIS — Z86711 Personal history of pulmonary embolism: Secondary | ICD-10-CM

## 2018-12-30 DIAGNOSIS — I509 Heart failure, unspecified: Secondary | ICD-10-CM

## 2018-12-30 DIAGNOSIS — I10 Essential (primary) hypertension: Secondary | ICD-10-CM

## 2018-12-30 LAB — POCT INR: INR: 1.1 — AB (ref 2.0–3.0)

## 2018-12-30 MED ORDER — WARFARIN SODIUM 5 MG PO TABS: ORAL_TABLET | ORAL | 0 refills | Status: DC

## 2018-12-30 MED ORDER — ENOXAPARIN SODIUM 40 MG/0.4ML ~~LOC~~ SOLN: 40.0000 mg | Freq: Every day | SUBCUTANEOUS | 1 refills | Status: DC

## 2018-12-30 MED ORDER — AMLODIPINE BESYLATE 10 MG PO TABS: 10.0000 mg | ORAL_TABLET | Freq: Every day | ORAL | 0 refills | Status: DC

## 2018-12-30 MED ORDER — LISINOPRIL 30 MG PO TABS: 30.0000 mg | ORAL_TABLET | Freq: Every day | ORAL | 0 refills | Status: DC

## 2018-12-30 NOTE — Patient Instructions (Signed)
Continue the Lovenox for the next 5 days. Increase coumadin to 7.5 mg on Friday, Sunday and Tuesday and 5 mg the rest of the week. Increase the Amlodipine to 10 mg daily. You can take 2 tablets of the Amlodipine 5 mg to equal the 10 mg. Increase the Lisinopril 20 mg to 1.5 tablets daily a total of 30 mg. Recheck INR and blood pressure in 5 days.

## 2019-01-02 DIAGNOSIS — I10 Essential (primary) hypertension: Secondary | ICD-10-CM | POA: Insufficient documentation

## 2019-01-02 DIAGNOSIS — Z599 Problem related to housing and economic circumstances, unspecified: Secondary | ICD-10-CM | POA: Insufficient documentation

## 2019-01-02 DIAGNOSIS — Z9114 Patient's other noncompliance with medication regimen: Secondary | ICD-10-CM | POA: Insufficient documentation

## 2019-01-02 DIAGNOSIS — Z598 Other problems related to housing and economic circumstances: Secondary | ICD-10-CM | POA: Insufficient documentation

## 2019-01-04 ENCOUNTER — Ambulatory Visit (INDEPENDENT_AMBULATORY_CARE_PROVIDER_SITE_OTHER): Payer: Self-pay | Admitting: Osteopathic Medicine

## 2019-01-04 DIAGNOSIS — Z86711 Personal history of pulmonary embolism: Secondary | ICD-10-CM

## 2019-01-04 DIAGNOSIS — Z7901 Long term (current) use of anticoagulants: Secondary | ICD-10-CM

## 2019-01-04 DIAGNOSIS — I509 Heart failure, unspecified: Secondary | ICD-10-CM

## 2019-01-04 DIAGNOSIS — I10 Essential (primary) hypertension: Secondary | ICD-10-CM

## 2019-01-04 LAB — POCT INR: INR: 1.3 — AB (ref 2.0–3.0)

## 2019-01-04 MED ORDER — LISINOPRIL 40 MG PO TABS: 40.0000 mg | ORAL_TABLET | Freq: Every day | ORAL | 1 refills | Status: DC

## 2019-01-04 NOTE — Patient Instructions (Signed)
Increase the Lisinopril to 40 mg daily, continue to take Amlodipine 10 mg daily. Increase the dose of the Coumadin to 7.5 mg on Sunday, Tuesday, Thursday and Friday, take 5 mg the rest of the week. Return in one week for follow up.

## 2019-01-13 ENCOUNTER — Ambulatory Visit (INDEPENDENT_AMBULATORY_CARE_PROVIDER_SITE_OTHER): Payer: Self-pay | Admitting: Sports Medicine

## 2019-01-13 ENCOUNTER — Other Ambulatory Visit: Payer: Self-pay

## 2019-01-13 DIAGNOSIS — Z7901 Long term (current) use of anticoagulants: Secondary | ICD-10-CM

## 2019-01-13 DIAGNOSIS — Z86711 Personal history of pulmonary embolism: Secondary | ICD-10-CM

## 2019-01-13 LAB — POCT INR: INR: 1.5 — AB (ref 2.0–3.0)

## 2019-01-13 NOTE — Patient Instructions (Addendum)
Continue to monitor your blood pressure. Continue taking the Amlodipine 10 mg daily and Lisinopril 40 mg daily. Increase dose, take 7.5 mg on Tuesday, Thursday, Friday, Saturday, and Sunday and 5 mg daily other days. Recheck in 1 week

## 2019-01-13 NOTE — Progress Notes (Signed)
Patient advised.

## 2019-01-13 NOTE — Progress Notes (Signed)
Increase dose, take 7.5 mg on Tuesday, Thursday, Friday, Saturday, and Sunday and 5 mg daily other days.  Recheck in 1 week

## 2019-01-20 ENCOUNTER — Ambulatory Visit (INDEPENDENT_AMBULATORY_CARE_PROVIDER_SITE_OTHER): Payer: Self-pay | Admitting: Physician Assistant

## 2019-01-20 ENCOUNTER — Other Ambulatory Visit: Payer: Self-pay

## 2019-01-20 ENCOUNTER — Encounter: Payer: Self-pay | Admitting: Physician Assistant

## 2019-01-20 VITALS — BP 160/77 | HR 48 | Temp 98.1°F | Wt 204.0 lb

## 2019-01-20 DIAGNOSIS — I10 Essential (primary) hypertension: Secondary | ICD-10-CM

## 2019-01-20 DIAGNOSIS — Z7901 Long term (current) use of anticoagulants: Secondary | ICD-10-CM

## 2019-01-20 DIAGNOSIS — R600 Localized edema: Secondary | ICD-10-CM

## 2019-01-20 DIAGNOSIS — Z86711 Personal history of pulmonary embolism: Secondary | ICD-10-CM

## 2019-01-20 DIAGNOSIS — R609 Edema, unspecified: Secondary | ICD-10-CM

## 2019-01-20 DIAGNOSIS — I509 Heart failure, unspecified: Secondary | ICD-10-CM

## 2019-01-20 LAB — POCT INR: INR: 1.3 — AB (ref 2.0–3.0)

## 2019-01-20 MED ORDER — WARFARIN SODIUM 7.5 MG PO TABS: 7.5000 mg | ORAL_TABLET | Freq: Every day | ORAL | 0 refills | Status: DC

## 2019-01-20 NOTE — Progress Notes (Signed)
HPI:                                                                Ronnie Bowman is a 63 y.o. male who presents to [facility] today for INR/HTN follow-up   63 yo M with PMH of CHF, HTN, COPD, hx of PE. He was lost to follow-up but was re-started on his anticoagulation with Lovenox bridge on 12/22/18. INR has yet to be therapeutic  He is currently taking Coumadin 7.5 mg on Tuesday, Thursday, Friday, Saturday, and Sunday and 5 mg all other days. Admits he drinks approx 2 glasses of wine per night. Eats mostly microwave dinners that are high in sodium.  Reports home blood pressure has been in range - 110/60 today according to him. He has noted approx 30 pound weight gain over the last year. Endorses chronic leg swelling that is unchanged. Denies PND, orthopnea.  Endorses chronic, unchanged mild dyspnea on exertion.  Reports he has been under increased stress because girlfriend is currently hospitalized.   Past Medical History:  Diagnosis Date  . Acute pulmonary embolism ([place])   . CHF (congestive heart failure) ([place])   . COPD (chronic obstructive pulmonary disease) ([place])   . Hypertension    History reviewed. No pertinent surgical history. Social History   Tobacco Use  . Smoking status: Former Smoker    Packs/day: 2.00    Years: 45.00    Pack years: 90.00    Types: Cigarettes    Quit date: 11/01/2017    Years since quitting: 1.2  . Smokeless tobacco: Never Used  Substance Use Topics  . Alcohol use: Yes    Alcohol/week: 3.0 standard drinks    Types: 3 Standard drinks or equivalent per week   family history includes Heart failure in his father; Leukemia in his mother.    ROS: negative except as noted in the HPI  Medications: Current Outpatient Medications  Medication Sig Dispense Refill  . amLODipine (NORVASC) 10 MG tablet Take 1 tablet (10 mg total) by mouth daily. 90 tablet 0  . atorvastatin (LIPITOR) 20 MG tablet Take 1  tablet (20 mg total) by mouth at bedtime. 90 tablet 0  . carvedilol (COREG) 25 MG tablet Take 1 tablet (25 mg total) by mouth 2 (two) times daily with a meal. 180 tablet 0  . lisinopril (ZESTRIL) 40 MG tablet Take 1 tablet (40 mg total) by mouth daily. 30 tablet 1  . warfarin (COUMADIN) 4 MG tablet Take 2 tablets (8 mg total) by mouth daily. 60 tablet 0   No current facility-administered medications for this visit.    No Known Allergies     Objective:  BP (!) 160/77   Pulse (!) 48   Temp 98.1 F (36.7 C) (Oral)   Wt 204 lb (92.5 kg)   SpO2 95%   BMI 30.13 kg/m  Vitals:   01/20/19 0946 01/20/19 1034  BP: (!) 150/74 (!) 160/77  Pulse: (!) 46 (!) 48  Temp: 98.1 F (36.7 C)   SpO2:  95%    Gen:  alert, not ill-appearing, no distress, appropriate for age, obese male HEENT: head normocephalic without obvious abnormality, conjunctiva and cornea clear, trachea midline, no JVD Pulm: Normal work of breathing, normal  phonation, poor air movement, no wheezes, rales or rhonchi CV: Normal rate, regular rhythm, s1 and s2 distant, no murmurs, clicks or rubs  GI: abdomen soft, mildly distended, no ascites, umbilical hernia, non-tender, exam limited due to body habitus Neuro: alert and oriented x 3, no tremor MSK: extremities atraumatic, normal gait and station, 1+ peripheral edema Skin: intact, venous stasis dermatitis of bilateral distal lower extremities   Lab Results  Component Value Date   CREATININE 0.87 12/21/2018   BUN 17 12/21/2018   NA 136 12/21/2018   K 4.7 12/21/2018   CL 101 12/21/2018   CO2 30 12/21/2018    ECG 12/22/2018 14:10 Vent rate 75 bpm Pr-I 142 ms QRS 82 ms QT/QTc 402/448 ms NSR, minimal voltage criteria for LVH, borderline ECG  Assessment and Plan: 63 y.o. male with   .Ronnie Bowman was seen today for medication management.  Diagnoses and all orders for this visit:  Chronic anticoagulation -     POCT INR -     Discontinue: warfarin (COUMADIN) 7.5 MG  tablet; Take 1 tablet (7.5 mg total) by mouth daily.  History of pulmonary embolism -     Discontinue: warfarin (COUMADIN) 7.5 MG tablet; Take 1 tablet (7.5 mg total) by mouth daily.  Hypertension goal BP (blood pressure) < 130/80  Elevated blood pressure reading in office with diagnosis of hypertension  Mild peripheral edema  Chronic congestive heart failure, unspecified heart failure type (HCC)     Uncontrolled HTN Blood pressure in stage II hypertensive range on 2 checks in office today, asymptomatic.  Home blood pressure in range according to patient Continue amlodipine 10 mg, carvedilol 25 mg twice daily and lisinopril 40 mg Encouraged to reduce his sodium consumption  Hx of PE Point-of-care INR is subtherapeutic Increase warfarin to 7-1/2 mg daily Reviewed importance of consistent vitamin K consumption in diet Encouraged to limit alcohol due to increased bleeding risk  Mild peripheral edema, CHF Weight is stable compared to last office visit. Gradual weight gain over the last year is likely due to poor diet and decreased physical activity Patient denies orthopnea, PND Patient does endorse unchanged dyspnea on exertion.  He is a smoker and has declined treatment for COPD 1+ edema likely secondary to amlodipine Counseled patient to check serial weights at home If patient gains 3 pounds in 1 day or 5 pounds in 1 week we will check BNP and echo We will try to obtain prior echocardiogram result Due to self-pay status/cost, patient declines referral to Cardiology today  Patient education and anticipatory guidance given Patient agrees with treatment plan Follow-up in 1 week for nurse INR/BP check or sooner as needed if symptoms worsen or fail to improve  Levonne Hubertharley E. Ogechi Kuehnel PA-C

## 2019-01-20 NOTE — Patient Instructions (Signed)
Vitamin K Foods and Warfarin Warfarin is a blood thinner (anticoagulant). Anticoagulant medicines help prevent the formation of blood clots. These medicines work by decreasing the activity of vitamin K, which promotes normal blood clotting. When you take warfarin, problems can occur from suddenly increasing or decreasing the amount of vitamin K that you eat from one day to the next. Problems may include:  Blood clots.  Bleeding. What general guidelines do I need to follow? To avoid problems when taking warfarin:  Eat a balanced diet that includes: ? Fresh fruits and vegetables. ? Whole grains. ? Low-fat dairy products. ? Lean proteins, such as fish, eggs, and lean cuts of meat.  Keep your intake of vitamin K consistent from day to day. To do this: ? Avoid eating large amounts of vitamin K one day and low amounts of vitamin K the next day. ? If you take a multivitamin that contains vitamin K, be sure to take it every day. ? Know which foods contain vitamin K. Use the lists below to understand serving sizes and the amount of vitamin K in one serving.  Avoid major changes in your diet. If you are going to change your diet, talk with your health care provider before making changes.  Work with a nutrition specialist (dietitian) to develop a meal plan that works best for you.  High vitamin K foods Foods that are high in vitamin K contain more than 100 mcg (micrograms) per serving. These include:  Broccoli (cooked) -  cup has 110 mcg.  Brussels sprouts (cooked) -  cup has 109 mcg.  Greens, beet (cooked) -  cup has 350 mcg.  Greens, collard (cooked) -  cup has 418 mcg.  Greens, turnip (cooked) -  cup has 265 mcg.  Green onions or scallions -  cup has 105 mcg.  Kale (fresh or frozen) -  cup has 531 mcg.  Parsley (raw) - 10 sprigs has 164 mcg.  Spinach (cooked) -  cup has 444 mcg.  Swiss chard (cooked) -  cup has 287 mcg. Moderate vitamin K foods Foods that have a  moderate amount of vitamin K contain 25-100 mcg per serving. These include:  Asparagus (cooked) - 5 spears have 38 mcg.  Black-eyed peas (dried) -  cup has 32 mcg.  Cabbage (cooked) -  cup has 37 mcg.  Kiwi fruit - 1 medium has 31 mcg.  Lettuce - 1 cup has 57-63 mcg.  Okra (frozen) -  cup has 44 mcg.  Prunes (dried) - 5 prunes have 25 mcg.  Watercress (raw) - 1 cup has 85 mcg. Low vitamin K foods Foods low in vitamin K contain less than 25 mcg per serving. These include:  Artichoke - 1 medium has 18 mcg.  Avocado - 1 oz. has 6 mcg.  Blueberries -  cup has 14 mcg.  Cabbage (raw) -  cup has 21 mcg.  Carrots (cooked) -  cup has 11 mcg.  Cauliflower (raw) -  cup has 11 mcg.  Cucumber with peel (raw) -  cup has 9 mcg.  Grapes -  cup has 12 mcg.  Mango - 1 medium has 9 mcg.  Nuts - 1 oz. has 15 mcg.  Pear - 1 medium has 8 mcg.  Peas (cooked) -  cup has 19 mcg.  Pickles - 1 spear has 14 mcg.  Pumpkin seeds - 1 oz. has 13 mcg.  Sauerkraut (canned) -  cup has 16 mcg.  Soybeans (cooked) -  cup has 16 mcg.    Tomato (raw) - 1 medium has 10 mcg.  Tomato sauce -  cup has 17 mcg. Vitamin K-free foods If a food contain less than 5 mcg per serving, it is considered to have no vitamin K. These foods include:  Bread and cereal products.  Cheese.  Eggs.  Fish and shellfish.  Meat and poultry.  Milk and dairy products.  Sunflower seeds. Actual amounts of vitamin K in foods may be different depending on processing. Talk with your dietitian about what foods you can eat and what foods you should avoid. This information is not intended to replace advice given to you by your health care provider. Make sure you discuss any questions you have with your health care provider. Document Released: 05/17/2009 Document Revised: 02/09/2016 Document Reviewed: 10/23/2015 Elsevier Interactive Patient Education  2019 Elsevier Inc.   What You Need to Know About  Warfarin Warfarin is a blood thinner (anticoagulant). Anticoagulants help to prevent the formation of blood clots. They also help to stop the growth of blood clots. Who should use warfarin? Warfarin is prescribed for people who are at risk for developing harmful blood clots, such as people who have:  Surgically implanted mechanical heart valves.  Irregular heart rhythms (atrial fibrillation).  Certain clotting disorders.  A history of harmful blood clotting in the past. This includes people who have had: ? A stroke. ? Blood clot in the lungs (pulmonary embolism, or PE). ? Blood clot in the legs (deep vein thrombosis, or DVT).  An existing blood clot. How is warfarin taken?  Warfarin is a medicine that you take by mouth (orally). Warfarin tablets come in different strengths. Each tablet strength is a different color, with the amount of warfarin printed on the tablet. If you get a new prescription filled and the color of your tablet is different than usual, tell your pharmacist or health care provider immediately. What blood tests do I need while taking warfarin? The goal of warfarin therapy is to lessen the clotting tendency of blood, but not to prevent clotting completely. Your health care provider will monitor the anticoagulation effect of warfarin closely and will adjust your dose as needed. Warfarin is a medicine that needs to be closely monitored, so it is very important to keep all lab visits and follow-up visits with your health care provider. While taking warfarin, you will need to have blood tests (prothrombin tests, or PT tests) regularly to measure your blood clotting time. This type of test can be done with a finger stick or a blood draw. What does the INR test result mean? The PT test results will be reported as the International Normalized Ratio (INR). The INR tells your health care provider whether your dosage of warfarin needs to be changed. The longer it takes your blood to  clot, the higher the INR. Your health care provider will tell you your target INR range. If your INR is not in your target range, your health care provider may adjust your dosage.  If your INR is above your target range, there is a risk of bleeding. Your dosage of warfarin may need to be decreased.  If your INR is below your target range, there is a risk of clotting. Your dosage of warfarin may need to be increased. How often is the INR test needed?  When you first start warfarin, you will usually have your INR checked every few days.  You may need to have INR tests done more than once a week until you are taking  the correct dosage of warfarin.  After you have reached your target INR, your INR will be tested less often. However, you will need to have your INR checked at least once every 4-6 weeks for the entire time you are taking warfarin. What are the side effects of warfarin? Too much warfarin can cause bleeding (hemorrhage) in any part of the body, such as:  Bleeding from the gums.  Unexplained bruises.  Bruises that get larger.  Blood in the urine.  Bloody or dark stools.  Bleeding in the brain (hemorrhagic stroke).  A nosebleed that is not easily stopped.  Coughing up blood.  Vomiting blood. Warfarin use may also cause:  Skin rash or irritations  Nausea that does not go away.  Severe pain in the back or joints.  Painful toes that turn blue or purple (purple toe syndrome).  Painful ulcers that do not go away (skin necrosis). What are the signs and symptoms of a blood clot? Too little warfarin can increase the risk of blood clots in your legs, lungs, or arms. Signs and symptoms of a DVT in your leg or arm may include:  Pain or swelling in your leg or arm.  Skin that is red or warm to the touch on your arm or leg. Signs and symptoms of a pulmonary embolism may include:  Shortness of breath or difficulty breathing.  Chest pain.  Unexplained fever. What are  the signs and symptoms of a stroke? If you are taking too much or too little warfarin, you can have a stroke. Signs and symptoms of a stroke may include:  Weakness or numbness of your face, arm, or leg, especially on one side of your body.  Confusion or trouble thinking clearly.  Difficulty seeing with one or both eyes.  Difficulty walking or moving your arms or legs.  Dizziness.  Loss of balance or coordination.  Trouble speaking, trouble understanding speech, or both (aphasia).  Sudden, severe headache with no known cause.  Partial or total loss of consciousness. What precautions do I need to take while using warfarin?  Take warfarin exactly as told by your health care provider. Doing this helps you avoid bleeding or blood clots that could result in serious injury, pain, or disability.  Take your medicine at the same time every day. If you forget to take your dose of warfarin, take it as soon as you remember that day. If you do not remember on that day, do not take an extra dose the next day.  Contact your health care provider if you miss or take an extra dose. Do not change your dosage on your own to make up for missed or extra doses.  Wear or carry identification that says that you are taking warfarin.  Make sure that all health care providers, including your dentist, know you are taking warfarin.  If you need surgery, talk with your health care provider about whether you should stop taking warfarin before your surgery.  Avoid situations that cause bleeding. You may bleed more easily while taking warfarin. To limit bleeding, take the following actions: ? Use a softer toothbrush. ? Floss with waxed floss, not unwaxed floss. ? Shave with an electric razor, not with a blade. ? Limit your use of sharp objects. ? Avoid potentially harmful activities, such as contact sports. What do I need to know about warfarin and pregnancy or breastfeeding?  Warfarin is not recommended  during the first trimester of pregnancy due to an increased risk of birth  defects. In certain situations, a woman may take warfarin after her first trimester of pregnancy.  If you are taking warfarin and you become pregnant or plan to become pregnant, contact your health care provider right away.  If you plan to breastfeed while taking warfarin, talk with your health care provider first. What do I need to know about warfarin and alcohol or drug use?  Avoid drinking alcohol, or limit alcohol intake to no more than 1 drink a day for nonpregnant women and 2 drinks a day for men. One drink equals 12 oz of beer, 5 oz of wine, or 1 oz of hard liquor. ? If you change the amount of alcohol that you drink, tell your health care provider. Your warfarin dosage may need to be changed.  Avoid tobacco products, such as cigarettes, chewing tobacco, and e-cigarettes. If you need help quitting, ask your health care provider. ? If you change the amount of nicotine or tobacco that you use, tell your health care provider. Your warfarin dosage may need to be changed.  Avoid street drugs while taking warfarin. The effects of street drugs on warfarin are not known. What do I need to know about warfarin and other medicines or supplements?  Many prescription and over-the-counter medicines can interfere with warfarin. Talk with your health care provider or your pharmacist before starting or stopping any new medicines. This includes over-the-counter vitamins, dietary supplements, herbal medicines, and pain medicines. Your warfarin dosage may need to be adjusted.  Some common over-the-counter medicines that may increase the risk of bleeding while taking warfarin include: ? Acetaminophen. ? Aspirin. ? NSAIDs, such as ibuprofen or naproxen. ? Vitamin E. What do I need to know about warfarin and my diet?  It is important to maintain a normal, balanced diet while taking warfarin. Avoid major changes in your diet. If you  are going to change your diet, talk with your health care provider before making changes.  Your health care provider may recommend that you work with a diet and nutrition specialist (dietitian).  Vitamin K decreases the effect of warfarin, and it is found in many foods. Eat a consistent amount of foods that contain vitamin K. For example, you may decide to eat 2 vitamin K-containing foods each day. Most foods that are high in vitamin K are green and leafy. Common foods that contain high amounts of vitamin K include:  Kale, raw or cooked.  Spinach, raw or cooked.  Collards, raw or cooked.  Swiss chard, raw or cooked.  Mustard greens, raw or cooked.  Turnip greens, raw or cooked.  Parsley, raw.  Broccoli, cooked.  Noodles, eggs, and spinach, enriched.  Brussels sprouts, raw or cooked.  Beet greens, raw or cooked.  Endive, raw.  Cabbage, cooked.  Asparagus, cooked. Foods that contain moderate amounts of vitamin K include:  Broccoli, raw.  Cabbage, raw.  Bok choy, cooked.  Green leaf lettuce, raw  Prunes, stewed.  Angie Fava.  Kiwi.  Edamame, cooked.  Romaine lettuce, raw.  Avocado.  Tuna, canned in oil.  Okra, cooked.  Black-eyed peas, cooked.  Green beans, cooked or raw.  Blueberries, raw.  Blackberries, raw.  Peas, cooked or raw. Contact a health care provider if:  You miss a dose.  You take an extra dose.  You plan to have any kind of surgery or procedure.  You are unable to take your medicine due to nausea, vomiting, or diarrhea.  You have any major changes in your diet or you plan to  make any major changes in your diet.  You start or stop any over-the-counter medicine, prescription medicine, or dietary supplement.  You become pregnant, plan to become pregnant, or think you may be pregnant.  You have menstrual periods that are heavier than usual.  You have unusual bruising. Get help right away if:  You develop symptoms of an  allergic reaction, such as: ? Swelling of the lips, face, tongue, mouth, or throat. ? Rash. ? Itching. ? Itchy, red, swollen areas of skin (hives). ? Trouble breathing. ? Chest tightness.  You have: ? Signs or symptoms of a stroke. ? Signs or symptoms of a blood clot. ? A fall or have an accident, especially if you hit your head. ? Blood in your urine. Your urine may look reddish, pinkish, or tea-colored. ? Blood in your stool. Your stool may be black or bright red. ? Bleeding that does not stop after applying pressure to the area for 30 minutes. ? Severe pain in your joints or back. ? Purple or blue toes. ? Skin ulcers that do not go away.  You vomit blood or cough up blood. The blood may be bright red, or it may look like coffee grounds. These symptoms may represent a serious problem that is an emergency. Do not wait to see if the symptoms will go away. Get medical help right away. Call your local emergency services (911 in the [place] Do not drive yourself to the hospital. Summary  Warfarin needs to be closely monitored with blood tests. It is very important to keep all lab visits and follow-up visits with your health care provider.  Make sure that you know your target INR range and your warfarin dosage.  Wear or carry identification that says that you are taking warfarin.  Take warfarin at the same time every day. Call your health care provider if you miss a dose or if you take an extra dose. Do not change the dosage of warfarin on your own.  Know the signs and symptoms of blood clots, bleeding, and a stroke. Know when to get emergency medical help.  Tell all health care providers who care for you that you are taking warfarin.  Talk with your health care provider or your pharmacist before starting or stopping any new medicines.  Monitor how much vitamin K you eat every day. Try to eat the same amount every day. This information is not intended to replace advice given to you  by your health care provider. Make sure you discuss any questions you have with your health care provider. Document Released: 07/20/2005 Document Revised: 11/23/2016 Document Reviewed: 10/16/2015 Elsevier Interactive Patient Education  [employer]2019 [employer] Inc.

## 2019-01-27 ENCOUNTER — Ambulatory Visit (INDEPENDENT_AMBULATORY_CARE_PROVIDER_SITE_OTHER): Payer: Self-pay | Admitting: Physician Assistant

## 2019-01-27 DIAGNOSIS — Z7901 Long term (current) use of anticoagulants: Secondary | ICD-10-CM

## 2019-01-27 DIAGNOSIS — Z86711 Personal history of pulmonary embolism: Secondary | ICD-10-CM

## 2019-01-27 LAB — POCT INR: INR: 1.4 — AB (ref 2.0–3.0)

## 2019-01-27 MED ORDER — WARFARIN SODIUM 7.5 MG PO TABS: ORAL_TABLET | ORAL | 0 refills | Status: DC

## 2019-01-27 NOTE — Patient Instructions (Signed)
Continue taking all of your medications. Schedule appointment for 1 week. Bring in all your medications next visit.

## 2019-02-02 ENCOUNTER — Ambulatory Visit (INDEPENDENT_AMBULATORY_CARE_PROVIDER_SITE_OTHER): Payer: Self-pay | Admitting: Physician Assistant

## 2019-02-02 VITALS — BP 146/66 | HR 51 | Wt 198.0 lb

## 2019-02-02 DIAGNOSIS — Z86711 Personal history of pulmonary embolism: Secondary | ICD-10-CM

## 2019-02-02 DIAGNOSIS — R609 Edema, unspecified: Secondary | ICD-10-CM

## 2019-02-02 DIAGNOSIS — Z7901 Long term (current) use of anticoagulants: Secondary | ICD-10-CM

## 2019-02-02 DIAGNOSIS — R0609 Other forms of dyspnea: Secondary | ICD-10-CM

## 2019-02-02 DIAGNOSIS — R600 Localized edema: Secondary | ICD-10-CM

## 2019-02-02 DIAGNOSIS — I509 Heart failure, unspecified: Secondary | ICD-10-CM

## 2019-02-02 LAB — POCT INR: INR: 1.5 — AB (ref 2.0–3.0)

## 2019-02-02 MED ORDER — WARFARIN SODIUM 4 MG PO TABS: 8.0000 mg | ORAL_TABLET | Freq: Every day | ORAL | 0 refills | Status: DC

## 2019-02-02 NOTE — Progress Notes (Signed)
At nurse visit today, patient reports that he has noted intermittent swelling in his lower legs. Also notes that he has increased dyspnea on exertion when his legs swell.  On physical exam, he has 1+ peripheral edema. Pulse ox is 96% on RA at rest. Respirations unlabored Prior echo is not on file. He has a history of unspecified CHF/cardiomyopathy Echo ordered today

## 2019-02-02 NOTE — Patient Instructions (Addendum)
Increase Coumadin to 8 mg daily. Take 2 tablets of the Coumadin 4 mg daily. Continue to take Amlodipine 10 mg daily, Lisinopril 40 mg daily, Atorvastatin 20 mg daily, Carvedilol 25 mg twice daily and Coumadin 8 mg daily.

## 2019-02-05 ENCOUNTER — Encounter: Payer: Self-pay | Admitting: Physician Assistant

## 2019-02-05 ENCOUNTER — Telehealth: Payer: Self-pay | Admitting: Physician Assistant

## 2019-02-05 DIAGNOSIS — R600 Localized edema: Secondary | ICD-10-CM | POA: Insufficient documentation

## 2019-02-05 DIAGNOSIS — I10 Essential (primary) hypertension: Secondary | ICD-10-CM | POA: Insufficient documentation

## 2019-02-05 NOTE — Telephone Encounter (Signed)
Can you contact patient to request info for medical records from previous PCP/Cardiologist. Specifically I need his prior Echocardiogram.

## 2019-02-06 NOTE — Telephone Encounter (Signed)
Left vm for pt to return call to clinic -EH/RMA  

## 2019-02-10 ENCOUNTER — Other Ambulatory Visit (HOSPITAL_BASED_OUTPATIENT_CLINIC_OR_DEPARTMENT_OTHER): Payer: Self-pay

## 2019-02-10 ENCOUNTER — Ambulatory Visit (INDEPENDENT_AMBULATORY_CARE_PROVIDER_SITE_OTHER): Payer: Self-pay | Admitting: Physician Assistant

## 2019-02-10 ENCOUNTER — Other Ambulatory Visit: Payer: Self-pay

## 2019-02-10 DIAGNOSIS — Z7901 Long term (current) use of anticoagulants: Secondary | ICD-10-CM

## 2019-02-10 DIAGNOSIS — Z86711 Personal history of pulmonary embolism: Secondary | ICD-10-CM

## 2019-02-10 LAB — POCT INR: INR: 1.7 — AB (ref 2.0–3.0)

## 2019-02-10 NOTE — Patient Instructions (Addendum)
Take 2.5 tablets on Tuesday and Thursday. Take 2 tablets on Sunday,  Monday, Wednesday, Friday and Saturday.

## 2019-02-10 NOTE — Progress Notes (Signed)
Patient presents to clinic for Coumadin Check. Patient reports that he was taking 8 mg every day of the week. Patient's INR was 1.7. Provider was made aware while patient requested to wait in clinic for the recommendations from the PCP. He did not want a phone call for the dose change. A copy of the recommendations was given to patient in the clinic and he voices understanding.    Patient reports that his swelling in the lower legs and ankles has got better and he is not eating as much processed food or frozen meals since his girlfriend is out of the hospital and back at home.   Per provider patient is to take 2.5 tablets on Tuesday and Thursday. He is to take 2 tablets on Sunday, Monday, Wednesday, Friday and Saturday. Patient did not have any additional questions.

## 2019-02-10 NOTE — Telephone Encounter (Signed)
Patient has seen Dr. Jinger Neighbors in [place] previously. The phone is [phone]. Patient reports that he has not had a cardiology but this was his PCP. The fax is [phone].

## 2019-02-10 NOTE — Telephone Encounter (Signed)
Addressed with pt at nurse visit

## 2019-02-17 ENCOUNTER — Ambulatory Visit (INDEPENDENT_AMBULATORY_CARE_PROVIDER_SITE_OTHER): Payer: Self-pay | Admitting: Physician Assistant

## 2019-02-17 ENCOUNTER — Other Ambulatory Visit: Payer: Self-pay

## 2019-02-17 VITALS — BP 133/68 | HR 57 | Wt 196.0 lb

## 2019-02-17 DIAGNOSIS — R791 Abnormal coagulation profile: Secondary | ICD-10-CM

## 2019-02-17 DIAGNOSIS — Z7901 Long term (current) use of anticoagulants: Secondary | ICD-10-CM

## 2019-02-17 DIAGNOSIS — Z86711 Personal history of pulmonary embolism: Secondary | ICD-10-CM

## 2019-02-17 DIAGNOSIS — R771 Abnormality of globulin: Secondary | ICD-10-CM

## 2019-02-17 LAB — POCT INR: INR: 1.6 — AB (ref 2.0–3.0)

## 2019-02-17 NOTE — Patient Instructions (Signed)
Continue taking Amlodipine 10 mg daily, Atorvastatin 20 mg daily, Carvedilol 25 mg twice daily and Lisinopril 40 mg daily.

## 2019-02-18 LAB — HEPATIC FUNCTION PANEL
AG Ratio: 0.9 (calc) — ABNORMAL LOW (ref 1.0–2.5)
ALT: 23 U/L (ref 9–46)
AST: 18 U/L (ref 10–35)
Albumin: 3.5 g/dL — ABNORMAL LOW (ref 3.6–5.1)
Alkaline phosphatase (APISO): 62 U/L (ref 35–144)
Bilirubin, Direct: 0.3 mg/dL — ABNORMAL HIGH (ref 0.0–0.2)
Globulin: 4 g/dL (calc) — ABNORMAL HIGH (ref 1.9–3.7)
Indirect Bilirubin: 1 mg/dL (calc) (ref 0.2–1.2)
Total Bilirubin: 1.3 mg/dL — ABNORMAL HIGH (ref 0.2–1.2)
Total Protein: 7.5 g/dL (ref 6.1–8.1)

## 2019-02-18 LAB — APTT: aPTT: 33 s (ref 22–34)

## 2019-02-18 LAB — PROTIME-INR
INR: 1.4 — ABNORMAL HIGH
Prothrombin Time: 14.1 s — ABNORMAL HIGH (ref 9.0–11.5)

## 2019-02-20 ENCOUNTER — Other Ambulatory Visit: Payer: Self-pay | Admitting: Osteopathic Medicine

## 2019-02-20 MED ORDER — WARFARIN SODIUM 10 MG PO TABS: 10.0000 mg | ORAL_TABLET | Freq: Every day | ORAL | 1 refills | Status: DC

## 2019-02-24 ENCOUNTER — Ambulatory Visit (INDEPENDENT_AMBULATORY_CARE_PROVIDER_SITE_OTHER): Payer: Self-pay | Admitting: Osteopathic Medicine

## 2019-02-24 ENCOUNTER — Other Ambulatory Visit: Payer: Self-pay

## 2019-02-24 ENCOUNTER — Ambulatory Visit: Payer: Self-pay

## 2019-02-24 DIAGNOSIS — Z7901 Long term (current) use of anticoagulants: Secondary | ICD-10-CM

## 2019-02-24 DIAGNOSIS — Z86711 Personal history of pulmonary embolism: Secondary | ICD-10-CM

## 2019-02-24 LAB — POCT INR: INR: 1.9 — AB (ref 2.0–3.0)

## 2019-02-24 NOTE — Progress Notes (Signed)
Pt in office today for PT/INR. Pt/INR today was 1.9. Per Dr. Sheppard Coil he will need to take 14 mg Fri., Sunday, Tuesday and Thurs. He will take 10 mg Sat., Monday and Wednesday. Pt will recheck in 1 week.

## 2019-03-03 ENCOUNTER — Ambulatory Visit (INDEPENDENT_AMBULATORY_CARE_PROVIDER_SITE_OTHER): Payer: Self-pay | Admitting: Physician Assistant

## 2019-03-03 ENCOUNTER — Other Ambulatory Visit: Payer: Self-pay

## 2019-03-03 VITALS — BP 145/62 | HR 55

## 2019-03-03 DIAGNOSIS — Z7901 Long term (current) use of anticoagulants: Secondary | ICD-10-CM

## 2019-03-03 DIAGNOSIS — I509 Heart failure, unspecified: Secondary | ICD-10-CM

## 2019-03-03 DIAGNOSIS — Z86711 Personal history of pulmonary embolism: Secondary | ICD-10-CM

## 2019-03-03 LAB — POCT INR: INR: 3 (ref 2.0–3.0)

## 2019-03-03 MED ORDER — WARFARIN SODIUM 10 MG PO TABS: ORAL_TABLET | ORAL | 1 refills | Status: DC

## 2019-03-03 MED ORDER — WARFARIN SODIUM 4 MG PO TABS: ORAL_TABLET | ORAL | 0 refills | Status: DC

## 2019-03-10 ENCOUNTER — Ambulatory Visit (INDEPENDENT_AMBULATORY_CARE_PROVIDER_SITE_OTHER): Payer: Self-pay | Admitting: Physician Assistant

## 2019-03-10 ENCOUNTER — Other Ambulatory Visit: Payer: Self-pay

## 2019-03-10 VITALS — BP 131/56 | HR 51 | Wt 200.0 lb

## 2019-03-10 DIAGNOSIS — I872 Venous insufficiency (chronic) (peripheral): Secondary | ICD-10-CM

## 2019-03-10 DIAGNOSIS — I10 Essential (primary) hypertension: Secondary | ICD-10-CM

## 2019-03-10 DIAGNOSIS — I509 Heart failure, unspecified: Secondary | ICD-10-CM

## 2019-03-10 DIAGNOSIS — Z86711 Personal history of pulmonary embolism: Secondary | ICD-10-CM

## 2019-03-10 DIAGNOSIS — Z7901 Long term (current) use of anticoagulants: Secondary | ICD-10-CM

## 2019-03-10 DIAGNOSIS — R609 Edema, unspecified: Secondary | ICD-10-CM

## 2019-03-10 LAB — POCT INR: INR: 3.8 — AB (ref 2.0–3.0)

## 2019-03-10 MED ORDER — WARFARIN SODIUM 4 MG PO TABS: ORAL_TABLET | ORAL | 0 refills | Status: DC

## 2019-03-10 MED ORDER — FUROSEMIDE 20 MG PO TABS: ORAL_TABLET | ORAL | 4 refills | Status: DC

## 2019-03-10 MED ORDER — AMLODIPINE BESYLATE 10 MG PO TABS: 5.0000 mg | ORAL_TABLET | Freq: Every day | ORAL | 0 refills | Status: DC

## 2019-03-10 MED ORDER — WARFARIN SODIUM 10 MG PO TABS: ORAL_TABLET | ORAL | 1 refills | Status: DC

## 2019-03-10 NOTE — Progress Notes (Signed)
Ronnie Bowman is here today for nurse visit INR He reports new rash on his left lower leg that is mildly itchy States he has had a similar rash on his right leg that comes and goes for years  On exam, distal lower extremities have 2+ peripheral edema. Skin appears ruddy, reddish and thickened  .Diagnoses and all orders for this visit:  Chronic congestive heart failure, unspecified heart failure type ([place]) -     furosemide (LASIX) 20 MG tablet; Take 1 tab by mouth daily. Take 1 additional tab in the afternoon as needed for swelling  History of pulmonary embolism  Long term current use of anticoagulants with INR goal of 2.0-3.0  Peripheral edema -     furosemide (LASIX) 20 MG tablet; Take 1 tab by mouth daily. Take 1 additional tab in the afternoon as needed for swelling  Uncontrolled stage 2 hypertension -     amLODipine (NORVASC) 10 MG tablet; Take 0.5 tablets (5 mg total) by mouth daily.  Venous stasis dermatitis of both lower extremities  Other orders -     POCT INR   Reduce Amlodipine to 5 mg Adding Lasix 20 mg every morning with optional second dose in the afternoon prn edema Counseled patient on general measures for edema including compression and elevation

## 2019-03-10 NOTE — Patient Instructions (Signed)
INR high. Reduce dose. Take 10 mg on Monday, Wednesday, Friday, Sunday and 14 mg all other days. Recheck in 1 week. Take the Lasix every morning and in the evening if needed. Reduce the Amlodipine to 5 mg daily.

## 2019-03-17 ENCOUNTER — Ambulatory Visit (INDEPENDENT_AMBULATORY_CARE_PROVIDER_SITE_OTHER): Payer: Self-pay | Admitting: Physician Assistant

## 2019-03-17 ENCOUNTER — Other Ambulatory Visit: Payer: Self-pay

## 2019-03-17 DIAGNOSIS — Z7901 Long term (current) use of anticoagulants: Secondary | ICD-10-CM

## 2019-03-17 DIAGNOSIS — I509 Heart failure, unspecified: Secondary | ICD-10-CM

## 2019-03-17 DIAGNOSIS — I10 Essential (primary) hypertension: Secondary | ICD-10-CM

## 2019-03-17 DIAGNOSIS — Z86711 Personal history of pulmonary embolism: Secondary | ICD-10-CM

## 2019-03-17 LAB — POCT INR: INR: 3.1 — AB (ref 2.0–3.0)

## 2019-03-17 MED ORDER — WARFARIN SODIUM 4 MG PO TABS: ORAL_TABLET | ORAL | 1 refills | Status: DC

## 2019-03-17 MED ORDER — CARVEDILOL 25 MG PO TABS: 25.0000 mg | ORAL_TABLET | Freq: Two times a day (BID) | ORAL | 1 refills | Status: DC

## 2019-03-17 NOTE — Patient Instructions (Signed)
Continue to take 10 mg on Monday, Wednesday, Friday, Sunday and 14 mg all other days. Recheck in 1 week. Continue taking Amlodipine 5 mg and Lasix every morning and as needed at night.

## 2019-03-24 ENCOUNTER — Other Ambulatory Visit: Payer: Self-pay

## 2019-03-24 ENCOUNTER — Ambulatory Visit (INDEPENDENT_AMBULATORY_CARE_PROVIDER_SITE_OTHER): Payer: Self-pay | Admitting: Physician Assistant

## 2019-03-24 DIAGNOSIS — Z86711 Personal history of pulmonary embolism: Secondary | ICD-10-CM

## 2019-03-24 DIAGNOSIS — Z7901 Long term (current) use of anticoagulants: Secondary | ICD-10-CM

## 2019-03-24 LAB — POCT INR: INR: 3.3 — AB (ref 2.0–3.0)

## 2019-03-24 NOTE — Progress Notes (Signed)
   Subjective:    Patient ID: Ronnie Bowman, male    DOB: [birth year], 63 y.o.   MRN: [id number]  HPI Patient here for INR check. Has no complaints of bleeding gums, blood in stool, easy bruising or SOB.KG LPN   Review of Systems     Objective:   Physical Exam        Assessment & Plan:  INR check today 3.3. Patient advised to take Coumadin 10mg  everyday except for Tuesday take Coumadin 14mg . Return in 1 week for recheck INR. KG LPN

## 2019-03-31 ENCOUNTER — Ambulatory Visit (INDEPENDENT_AMBULATORY_CARE_PROVIDER_SITE_OTHER): Payer: Self-pay | Admitting: Physician Assistant

## 2019-03-31 ENCOUNTER — Other Ambulatory Visit: Payer: Self-pay

## 2019-03-31 DIAGNOSIS — Z86711 Personal history of pulmonary embolism: Secondary | ICD-10-CM

## 2019-03-31 DIAGNOSIS — Z7901 Long term (current) use of anticoagulants: Secondary | ICD-10-CM

## 2019-03-31 LAB — POCT INR: INR: 3.8 — AB (ref 2.0–3.0)

## 2019-04-02 ENCOUNTER — Other Ambulatory Visit: Payer: Self-pay | Admitting: Physician Assistant

## 2019-04-02 DIAGNOSIS — I509 Heart failure, unspecified: Secondary | ICD-10-CM

## 2019-04-07 ENCOUNTER — Other Ambulatory Visit: Payer: Self-pay

## 2019-04-07 ENCOUNTER — Telehealth: Payer: Self-pay | Admitting: Physician Assistant

## 2019-04-07 ENCOUNTER — Ambulatory Visit (INDEPENDENT_AMBULATORY_CARE_PROVIDER_SITE_OTHER): Payer: Self-pay | Admitting: Physician Assistant

## 2019-04-07 VITALS — BP 128/65 | HR 53

## 2019-04-07 DIAGNOSIS — Z86711 Personal history of pulmonary embolism: Secondary | ICD-10-CM

## 2019-04-07 DIAGNOSIS — Z7901 Long term (current) use of anticoagulants: Secondary | ICD-10-CM

## 2019-04-07 DIAGNOSIS — I509 Heart failure, unspecified: Secondary | ICD-10-CM

## 2019-04-07 DIAGNOSIS — I10 Essential (primary) hypertension: Secondary | ICD-10-CM

## 2019-04-07 LAB — POCT INR: INR: 3.6 — AB (ref 2.0–3.0)

## 2019-04-07 MED ORDER — LISINOPRIL 40 MG PO TABS: 40.0000 mg | ORAL_TABLET | Freq: Every day | ORAL | 0 refills | Status: DC

## 2019-04-07 NOTE — Telephone Encounter (Signed)
Refill sent to pharmacy -EH/RMA  

## 2019-04-07 NOTE — Telephone Encounter (Signed)
Ronnie Bowman called at 4:00 this afternoon requesting a refill on lisinopril (40mg  tablets). He apologized for calling so late, but he did say that he only has 1 tablet left. Pharmacy on file is correct.

## 2019-04-07 NOTE — Telephone Encounter (Signed)
Patient notified refill sent to pharmacy.  

## 2019-04-07 NOTE — Progress Notes (Signed)
Spoke to [place], patient is to decrease to 8 mg on Tuesday/Thursday, 10 mg all other days. Recheck 1 week. Patient advised

## 2019-04-14 ENCOUNTER — Ambulatory Visit (INDEPENDENT_AMBULATORY_CARE_PROVIDER_SITE_OTHER): Payer: Self-pay | Admitting: Physician Assistant

## 2019-04-14 ENCOUNTER — Other Ambulatory Visit: Payer: Self-pay

## 2019-04-14 DIAGNOSIS — Z86711 Personal history of pulmonary embolism: Secondary | ICD-10-CM

## 2019-04-14 DIAGNOSIS — Z7901 Long term (current) use of anticoagulants: Secondary | ICD-10-CM

## 2019-04-14 LAB — POCT INR: INR: 2.4 (ref 2.0–3.0)

## 2019-04-14 NOTE — Progress Notes (Signed)
Pt here for INR check no missed doses, diet changes,bruising,bleeding,CP, he c/o experiencing more  SOB last night and feels that it could be sinuses. Denies any facial or eye pain. And reports that the veins in both of his legs have gotten worse.  Spoke w/pcp she advised that he be scheduled w/her for f/u and INR for his complaints. She wants him to continue w/his current dosing.  Advised pt that he really needs to have the ECHO done. He stated that he has no insurance and cannot afford this. I advised him to ask about a payment plan so he can have this done.Ronnie Bowman  Ronnie KitchenElouise Munroe, [name]

## 2019-04-14 NOTE — Patient Instructions (Signed)
If your breathing gets worse before you see Charlie next week please go to the UC or ED.  Feel free to call our office if you have any questions.

## 2019-04-21 ENCOUNTER — Ambulatory Visit (INDEPENDENT_AMBULATORY_CARE_PROVIDER_SITE_OTHER): Payer: Self-pay

## 2019-04-21 ENCOUNTER — Encounter: Payer: Self-pay | Admitting: Physician Assistant

## 2019-04-21 ENCOUNTER — Other Ambulatory Visit: Payer: Self-pay

## 2019-04-21 ENCOUNTER — Ambulatory Visit (INDEPENDENT_AMBULATORY_CARE_PROVIDER_SITE_OTHER): Payer: Self-pay | Admitting: Physician Assistant

## 2019-04-21 VITALS — BP 160/77 | HR 50 | Temp 97.7°F | Wt 206.0 lb

## 2019-04-21 DIAGNOSIS — Z7901 Long term (current) use of anticoagulants: Secondary | ICD-10-CM

## 2019-04-21 DIAGNOSIS — R0600 Dyspnea, unspecified: Secondary | ICD-10-CM

## 2019-04-21 DIAGNOSIS — R0609 Other forms of dyspnea: Secondary | ICD-10-CM

## 2019-04-21 DIAGNOSIS — I509 Heart failure, unspecified: Secondary | ICD-10-CM

## 2019-04-21 DIAGNOSIS — R0601 Orthopnea: Secondary | ICD-10-CM

## 2019-04-21 DIAGNOSIS — R609 Edema, unspecified: Secondary | ICD-10-CM

## 2019-04-21 LAB — CBC WITH DIFFERENTIAL/PLATELET
Absolute Monocytes: 898 cells/uL (ref 200–950)
Basophils Absolute: 62 cells/uL (ref 0–200)
Basophils Relative: 0.7 %
Eosinophils Absolute: 317 cells/uL (ref 15–500)
Eosinophils Relative: 3.6 %
HCT: 46.4 % (ref 38.5–50.0)
Hemoglobin: 16.1 g/dL (ref 13.2–17.1)
Lymphs Abs: 2702 cells/uL (ref 850–3900)
MCH: 32.9 pg (ref 27.0–33.0)
MCHC: 34.7 g/dL (ref 32.0–36.0)
MCV: 94.9 fL (ref 80.0–100.0)
MPV: 11.2 fL (ref 7.5–12.5)
Monocytes Relative: 10.2 %
Neutro Abs: 4822 cells/uL (ref 1500–7800)
Neutrophils Relative %: 54.8 %
Platelets: 231 10*3/uL (ref 140–400)
RBC: 4.89 10*6/uL (ref 4.20–5.80)
RDW: 13.1 % (ref 11.0–15.0)
Total Lymphocyte: 30.7 %
WBC: 8.8 10*3/uL (ref 3.8–10.8)

## 2019-04-21 LAB — COMPLETE METABOLIC PANEL WITH GFR
AG Ratio: 0.9 (calc) — ABNORMAL LOW (ref 1.0–2.5)
ALT: 22 U/L (ref 9–46)
AST: 18 U/L (ref 10–35)
Albumin: 3.7 g/dL (ref 3.6–5.1)
Alkaline phosphatase (APISO): 55 U/L (ref 35–144)
BUN: 19 mg/dL (ref 7–25)
CO2: 31 mmol/L (ref 20–32)
Calcium: 9.2 mg/dL (ref 8.6–10.3)
Chloride: 100 mmol/L (ref 98–110)
Creat: 1.04 mg/dL (ref 0.70–1.25)
GFR, Est African American: 90 mL/min/{1.73_m2} (ref >=60)
GFR, Est Non African American: 78 mL/min/{1.73_m2} (ref >=60)
Globulin: 4.1 g/dL (calc) — ABNORMAL HIGH (ref 1.9–3.7)
Glucose, Bld: 109 mg/dL — ABNORMAL HIGH (ref 65–99)
Potassium: 4.1 mmol/L (ref 3.5–5.3)
Sodium: 136 mmol/L (ref 135–146)
Total Bilirubin: 1.1 mg/dL (ref 0.2–1.2)
Total Protein: 7.8 g/dL (ref 6.1–8.1)

## 2019-04-21 LAB — BRAIN NATRIURETIC PEPTIDE: Brain Natriuretic Peptide: 46 pg/mL (ref <100)

## 2019-04-21 LAB — POCT INR: INR: 2.2 (ref 2.0–3.0)

## 2019-04-21 MED ORDER — FUROSEMIDE 40 MG PO TABS: 40.0000 mg | ORAL_TABLET | Freq: Two times a day (BID) | ORAL | 0 refills | Status: DC

## 2019-04-21 MED ORDER — SPIRONOLACTONE 25 MG PO TABS: 25.0000 mg | ORAL_TABLET | Freq: Every day | ORAL | 0 refills | Status: DC

## 2019-04-21 NOTE — Progress Notes (Signed)
HPI:                                                                Ronnie Bowman is a 63 y.o. male who presents to [facility] today for chronic dyspnea  Reports waxing and waning SOB, both at rest and with mild exertion such as bending over. He is breathless after walking up 1 flight of stairs; reports it has been bad for about a month. He denies increased swelling; states Furosemide has helped with this. He endorses orthopnea, sleeps on the sofa with 2-3 pillows. He feels "smothered" when laying flat. He has not smoked in a year and a half, but has a 45 packyear smoking history. He states in the past inhalers have not been helpful.  He most recently has used his girlfriend's Albuterol on a couple of occasions the last week and reports it was not helpful.   He has a diagnosis of heart failure but has not had his echo due to financial difficulties.  Past Medical History:  Diagnosis Date  . Acute pulmonary embolism ([place])   . CHF (congestive heart failure) ([place])   . COPD (chronic obstructive pulmonary disease) ([place])   . Hypertension    No past surgical history on file. Social History   Tobacco Use  . Smoking status: Former Smoker    Packs/day: 2.00    Years: 45.00    Pack years: 90.00    Types: Cigarettes    Quit date: 11/01/2017    Years since quitting: 1.4  . Smokeless tobacco: Never Used  Substance Use Topics  . Alcohol use: Yes    Alcohol/week: 3.0 standard drinks    Types: 3 Standard drinks or equivalent per week   family history includes Heart failure in his father; Leukemia in his mother.    ROS: negative except as noted in the HPI  Medications: Current Outpatient Medications  Medication Sig Dispense Refill  . AMBULATORY NON FORMULARY MEDICATION Take 2 each by mouth every morning. Medication Name: [place] gummies    . amLODipine (NORVASC) 10 MG tablet Take 0.5 tablets (5 mg total) by mouth daily. 90 tablet 0  .  atorvastatin (LIPITOR) 20 MG tablet TAKE 1 TABLET BY MOUTH AT BEDTIME 90 tablet 0  . carvedilol (COREG) 25 MG tablet Take 1 tablet (25 mg total) by mouth 2 (two) times daily with a meal. 180 tablet 1  . furosemide (LASIX) 20 MG tablet Take 1 tab by mouth daily. Take 1 additional tab in the afternoon as needed for swelling 60 tablet 4  . lisinopril (ZESTRIL) 40 MG tablet Take 1 tablet (40 mg total) by mouth daily. 90 tablet 0  . Melatonin 10 MG CAPS Take 10 mg by mouth.    . warfarin (COUMADIN) 10 MG tablet Take 10 mg on Mondays, Wednesdays, Fridays and Sundays. Take 14 mg all other days 30 tablet 1  . warfarin (COUMADIN) 4 MG tablet Take 10 mg on M,W,F, Sunday and 14 mg all other days 30 tablet 1   No current facility-administered medications for this visit.    No Known Allergies     Objective:  BP (!) 160/77   Pulse (!) 50   Temp 97.7 F (36.5 C) (Oral)  Wt 206 lb (93.4 kg)   SpO2 91%   BMI 30.42 kg/m   Wt Readings from Last 3 Encounters:  04/21/19 206 lb (93.4 kg)  04/14/19 205 lb (93 kg)  03/31/19 200 lb (90.7 kg)   Temp Readings from Last 3 Encounters:  04/21/19 97.7 F (36.5 C) (Oral)  04/14/19 98.4 F (36.9 C)  03/31/19 98.1 F (36.7 C) (Oral)   BP Readings from Last 3 Encounters:  04/21/19 (!) 160/77  04/14/19 128/68  04/07/19 128/65   Pulse Readings from Last 3 Encounters:  04/21/19 (!) 50  04/14/19 (!) 52  04/07/19 (!) 53    Gen:  alert, not ill-appearing, no distress, appropriate for age HEENT: head normocephalic without obvious abnormality, conjunctiva and cornea clear, trachea midline Pulm: Normal work of breathing, normal phonation, poor air movement, no rales or rhonchi CV: bradycardic rate, regular rhythm, heart sounds distant, no murmurs, clicks or rubs; mild JVD Neuro: alert and oriented x 3, no tremor MSK: extremities atraumatic, normal gait and station, 2+ peripheral edema Skin: intact, no rashes on exposed skin, no jaundice, no  cyanosis Psych: well-groomed, cooperative, good eye contact, euthymic mood, affect mood-congruent, speech is articulate, and thought processes clear and goal-directed    Results for orders placed or performed in visit on 04/21/19 (from the past 72 hour(s))  POCT INR     Status: Normal   Collection Time: 04/21/19  9:04 AM  Result Value Ref Range   INR 2.2 2.0 - 3.0   No results found.    Assessment and Plan: 63 y.o. male with   .Ronnie Bowman was seen today for follow-up.  Diagnoses and all orders for this visit:  Long term current use of anticoagulants with INR goal of 2.0-3.0 -     POCT INR -     CBC with Differential/Platelet  Chronic dyspnea -     DG Chest 2 View -     CBC with Differential/Platelet  Chronic congestive heart failure, unspecified heart failure type (HCC) -     DG Chest 2 View -     furosemide (LASIX) 40 MG tablet; Take 1 tablet (40 mg total) by mouth 2 (two) times daily for 7 days.  Peripheral edema -     furosemide (LASIX) 40 MG tablet; Take 1 tablet (40 mg total) by mouth 2 (two) times daily for 7 days.  Congestive heart failure, unspecified HF chronicity, unspecified heart failure type (HCC) -     COMPLETE METABOLIC PANEL WITH GFR -     B Nat Peptide  Orthopnea  Other orders -     spironolactone (ALDACTONE) 25 MG tablet; Take 1 tablet (25 mg total) by mouth daily.  Mildly Hypoxic at 92-93% on RA at rest Evidence of volume overload on exam. Suspect increasing dyspnea is due to CHF and not COPD exacerbation CXR pending Increasing Lasix to 40 mg bid Adding Spironolactone 25 mg daily Discussed importance of Echo with patient and to work with financial assistance   INR therapeutic. Cont current dose    Patient education and anticipatory guidance given Patient agrees with treatment plan Follow-up in 1 week with Dr. Karie Schwalbe or sooner as needed if symptoms worsen or fail to improve  Ronnie Hubertharley E. Cummings PA-C

## 2019-04-21 NOTE — Patient Instructions (Addendum)
Chest X-ray and labs today Increase Furosemide/Laxis to 40 mg (2 tabs) twice a day for 1 week Start Spironolactone 25 mg once daily Follow-up with Dr. Darene Lamer in 1 week   Heart Failure, Self Care Heart failure is a serious condition. This document explains the things you need to do to take care of yourself after a heart failure diagnosis. You may be asked to change your diet, take certain medicines, and make other lifestyle changes in order to stay as healthy as possible. Your health care provider may also give you more specific instructions. If you have problems or questions, contact your health care provider. What are the risks? Having heart failure puts you at higher risk for certain problems. These problems can get worse if you do not take good care of yourself. Problems may include:  Blood clotting problems. This may cause a stroke.  Damage to the kidneys, liver, or lungs.  Abnormal heart rhythms. Supplies needed:  Scale for monitoring weight.  Blood pressure monitor.  Notebook.  Medicines. How to care for yourself when you have heart failure Medicines Take over-the-counter and prescription medicines only as told by your health care provider. Medicines reduce the workload of your heart, slow the progression of heart failure, and improve symptoms. Take your medicines every day.  Do not stop taking your medicine unless your health care provider tells you to do so.  Do not skip any dose of medicine.  Refill your prescriptions before you run out of medicine. Eating and drinking   Eat heart-healthy foods. Talk with a dietitian to make an eating plan that is right for you. ? Choose foods that contain no trans fat and are low in saturated fat and cholesterol. Healthy choices include fresh or frozen fruits and vegetables, fish, lean meats, legumes, fat-free or low-fat dairy products, and whole-grain or high-fiber foods. ? Limit salt (sodium) if told by your health care provider. Sodium  restriction may reduce symptoms of heart failure. Ask a dietitian to recommend heart-healthy seasonings. ? Use healthy cooking methods instead of frying. Healthy methods include roasting, grilling, broiling, baking, poaching, steaming, and stir-frying.  Limit your fluid intake, if directed by your health care provider. Fluid restriction may reduce symptoms of heart failure. Alcohol use  Do not drink alcohol if: ? Your health care provider tells you not to drink. ? Your heart was damaged by alcohol, or you have severe heart failure. ? You are pregnant, may be pregnant, or are planning to become pregnant.  If you drink alcohol: ? Limit how much you use to:  0-1 drink a day for women.  0-2 drinks a day for men. ? Be aware of how much alcohol is in your drink. In the [place] one drink equals one 12 oz bottle of beer (355 mL), one 5 oz glass of wine (148 mL), or one 1 oz glass of hard liquor (44 mL). Lifestyle   Do not use any products that contain nicotine or tobacco, such as cigarettes, e-cigarettes, and chewing tobacco. If you need help quitting, ask your health care provider. ? Do not use nicotine gum or patches before talking to your health care provider.  Do not use illegal drugs.  Work with your health care provider to safely reach the right body weight.  Do physical activity if told by your health care provider. Talk to your health care provider before you begin an exercise if: ? You are an older adult. ? You have severe heart failure.  Learn to manage  stress. If you need help to do this, ask your health care provider.  Participate in or seek rehabilitation as needed to keep or improve your independence and quality of life.  Plan rest periods when you get tired. Monitoring important information   Weigh yourself every day. This will help you to notice if too much fluid is building up in your body. ? Weigh yourself every morning after you urinate and before you eat  breakfast. ? Wear the same amount of clothing each time you weigh yourself. ? Record your daily weight. Provide your health care provider with your weight record.  Monitor and record your pulse and blood pressure as told by your health care provider. Dealing with extreme temperatures  If the weather is extremely hot: ? Avoid vigorous physical activity. ? Use air conditioning or fans, or find a cooler location. ? Avoid caffeine and alcohol. ? Wear loose-fitting, lightweight, and light-colored clothing.  If the weather is extremely cold: ? Avoid vigorous activity. ? Layer your clothes. ? Wear mittens or gloves, a hat, and a scarf when you go outside. ? Avoid alcohol. Follow these instructions at home:  Stay up to date with vaccines. Pneumococcal and flu (influenza) vaccines are especially important in preventing infections of the airways.  Keep all follow-up visits as told by your health care provider. This is important. Contact a health care provider if you:  Have a rapid weight gain.  Have increasing shortness of breath.  Are unable to participate in your usual physical activities.  Get tired easily.  Cough more than normal, especially with physical activity.  Lose your appetite or feel nauseous.  Have any swelling or more swelling in areas such as your hands, feet, ankles, or abdomen.  Are unable to sleep because it is hard to breathe.  Feel like your heart is beating quickly (palpitations).  Become dizzy or light-headed when you stand up. Get help right away if you:  Have trouble breathing.  Notice or your family notices a change in your awareness, such as having trouble staying awake or concentrating.  Have pain or discomfort in your chest.  Have an episode of fainting (syncope). These symptoms may represent a serious problem that is an emergency. Do not wait to see if the symptoms will go away. Get medical help right away. Call your local emergency services  (911 in the [place] Do not drive yourself to the hospital. Summary  Heart failure is a serious condition. To care for yourself, you may be asked to change your diet, take certain medicines, and make other lifestyle changes.  Take your medicines every day. Do not stop taking them unless your health care provider tells you to do so.  Eat heart-healthy foods, such as fresh or frozen fruits and vegetables, fish, lean meats, legumes, fat-free or low-fat dairy products, and whole-grain or high-fiber foods.  Ask your health care provider if you have any alcohol restrictions. You may have to stop drinking alcohol if you have severe heart failure.  Contact your health care provider if you notice problems, such as rapid weight gain or a fast heartbeat. Get help right away if you faint, or have chest pain or trouble breathing. This information is not intended to replace advice given to you by your health care provider. Make sure you discuss any questions you have with your health care provider. Document Released: 11/02/2018 Document Revised: 11/01/2018 Document Reviewed: 11/02/2018 Elsevier Patient Education  2020 [employer]

## 2019-04-27 ENCOUNTER — Other Ambulatory Visit (HOSPITAL_BASED_OUTPATIENT_CLINIC_OR_DEPARTMENT_OTHER): Payer: Self-pay

## 2019-04-28 ENCOUNTER — Other Ambulatory Visit: Payer: Self-pay

## 2019-04-28 ENCOUNTER — Encounter: Payer: Self-pay | Admitting: Sports Medicine

## 2019-04-28 ENCOUNTER — Other Ambulatory Visit: Payer: Self-pay | Admitting: Osteopathic Medicine

## 2019-04-28 ENCOUNTER — Ambulatory Visit (HOSPITAL_BASED_OUTPATIENT_CLINIC_OR_DEPARTMENT_OTHER)
Admission: RE | Admit: 2019-04-28 | Discharge: 2019-04-28 | Disposition: A | Discharge status: Home / Self Care | Payer: Self-pay | Source: Ambulatory Visit | Attending: Physician Assistant | Admitting: Physician Assistant

## 2019-04-28 ENCOUNTER — Ambulatory Visit (INDEPENDENT_AMBULATORY_CARE_PROVIDER_SITE_OTHER): Payer: Self-pay | Admitting: Sports Medicine

## 2019-04-28 VITALS — BP 126/70 | HR 61 | Ht 69.0 in | Wt 203.0 lb

## 2019-04-28 DIAGNOSIS — I509 Heart failure, unspecified: Secondary | ICD-10-CM

## 2019-04-28 DIAGNOSIS — Z7901 Long term (current) use of anticoagulants: Secondary | ICD-10-CM

## 2019-04-28 DIAGNOSIS — E8809 Other disorders of plasma-protein metabolism, not elsewhere classified: Secondary | ICD-10-CM

## 2019-04-28 DIAGNOSIS — J449 Chronic obstructive pulmonary disease, unspecified: Secondary | ICD-10-CM

## 2019-04-28 DIAGNOSIS — Z86711 Personal history of pulmonary embolism: Secondary | ICD-10-CM

## 2019-04-28 LAB — POCT INR: INR: 2.6 (ref 2.0–3.0)

## 2019-04-28 LAB — ECHOCARDIOGRAM COMPLETE
Height: 69 in
Weight: 3248 oz

## 2019-04-28 MED ORDER — SPIRONOLACTONE 50 MG PO TABS: 50.0000 mg | ORAL_TABLET | Freq: Every day | ORAL | 3 refills | Status: DC

## 2019-04-28 NOTE — Assessment & Plan Note (Signed)
Has not been doing Symbicort. He will restart Symbicort twice a day, I do think he has developed a bit of cor pulmonale.

## 2019-04-28 NOTE — Assessment & Plan Note (Signed)
INR is good.

## 2019-04-28 NOTE — Progress Notes (Signed)
  Echocardiogram 2D Echocardiogram has been performed.  Ronnie Bowman 04/28/2019, 2:35 PM

## 2019-04-28 NOTE — Progress Notes (Signed)
Subjective:    CC: Follow-up  HPI: Ronnie Bowman is a 63 year old male with COPD and CHF.  He has historically been seen by Gena Fray, PA-C, he was having some shortness of breath at the last visit, Charlie increased his Lasix to 40 mg twice daily but he has only been taking 20 mg twice a day, we also had her add spironolactone 25.  He is on an ACE inhibitor and Coreg.  Unfortunately he has only had a minimal improvement in his symptoms.  He is noting worsening protuberance of his abdomen.  LFTs were normal approximately 2 weeks ago.  He also has COPD, he tells me he does have Symbicort but has not been using it daily.  I reviewed the past medical history, family history, social history, surgical history, and allergies today and no changes were needed.  Please see the problem list section below in epic for further details.  Past Medical History: Past Medical History:  Diagnosis Date  . Acute pulmonary embolism (HCC)   . CHF (congestive heart failure) (HCC)   . COPD (chronic obstructive pulmonary disease) (HCC)   . Hypertension    Past Surgical History: No past surgical history on file. Social History: Social History   Socioeconomic History  . Marital status: Single    Spouse name: Not on file  . Number of children: Not on file  . Years of education: Not on file  . Highest education level: Not on file  Occupational History  . Not on file  Social Needs  . Financial resource strain: Not on file  . Food insecurity    Worry: Not on file    Inability: Not on file  . Transportation needs    Medical: Not on file    Non-medical: Not on file  Tobacco Use  . Smoking status: Former Smoker    Packs/day: 2.00    Years: 45.00    Pack years: 90.00    Types: Cigarettes    Quit date: 11/01/2017    Years since quitting: 1.4  . Smokeless tobacco: Never Used  Substance and Sexual Activity  . Alcohol use: Yes    Alcohol/week: 3.0 standard drinks    Types: 3 Standard drinks or equivalent  per week  . Drug use: Never  . Sexual activity: Yes    Birth control/protection: None  Lifestyle  . Physical activity    Days per week: Not on file    Minutes per session: Not on file  . Stress: Not on file  Relationships  . Social Musician on phone: Not on file    Gets together: Not on file    Attends religious service: Not on file    Active member of club or organization: Not on file    Attends meetings of clubs or organizations: Not on file    Relationship status: Not on file  Other Topics Concern  . Not on file  Social History Narrative  . Not on file   Family History: Family History  Problem Relation Age of Onset  . Leukemia Mother   . Heart failure Father    Allergies: No Known Allergies Medications: See med rec.  Review of Systems: No fevers, chills, night sweats, weight loss, chest pain, or shortness of breath.   Objective:    General: Well Developed, well nourished, and in no acute distress.  Neuro: Alert and oriented x3, extra-ocular muscles intact, sensation grossly intact.  HEENT: Normocephalic, atraumatic, pupils equal round reactive to light, neck  supple, no masses, no lymphadenopathy, thyroid nonpalpable.  Skin: Warm and dry, no rashes. Cardiac: Regular rate and rhythm, no murmurs rubs or gallops, trace lower extremity edema.  Respiratory: Clear to auscultation bilaterally although not moving good air, no wheezes, crackles, rales. Not using accessory muscles, speaking in full sentences.  Impression and Recommendations:    Congestive heart failure (HCC) A little bit hypervolemic on exam today, he does have a protuberant abdomen, I am concerned he is developing significant ascites. I would like an abdominal ultrasound looking at his liver and for ascites. Continue Coreg, lisinopril. Increasing Lasix to 40 mg twice a day, increasing spironolactone to 50 mg daily. He has an echocardiogram scheduled for today.  Chronic obstructive pulmonary  disease (HCC) Has not been doing Symbicort. He will restart Symbicort twice a day, I do think he has developed a bit of cor pulmonale.  Long term current use of anticoagulants with INR goal of 2.0-3.0 INR is good.   ___________________________________________ Gwen Her. Dianah Field, M.D., ABFM., CAQSM. Primary Care and Sports Medicine [facility]  Adjunct Professor of [employer] [facility] [employer]

## 2019-04-28 NOTE — Assessment & Plan Note (Signed)
A little bit hypervolemic on exam today, he does have a protuberant abdomen, I am concerned he is developing significant ascites. I would like an abdominal ultrasound looking at his liver and for ascites. Continue Coreg, lisinopril. Increasing Lasix to 40 mg twice a day, increasing spironolactone to 50 mg daily. He has an echocardiogram scheduled for today.

## 2019-05-12 ENCOUNTER — Encounter: Payer: Self-pay | Admitting: Osteopathic Medicine

## 2019-05-12 ENCOUNTER — Ambulatory Visit (INDEPENDENT_AMBULATORY_CARE_PROVIDER_SITE_OTHER): Payer: Self-pay | Admitting: Osteopathic Medicine

## 2019-05-12 ENCOUNTER — Ambulatory Visit (INDEPENDENT_AMBULATORY_CARE_PROVIDER_SITE_OTHER): Payer: Self-pay

## 2019-05-12 ENCOUNTER — Other Ambulatory Visit: Payer: Self-pay

## 2019-05-12 VITALS — BP 110/68 | HR 54 | Temp 98.1°F | Wt 202.0 lb

## 2019-05-12 DIAGNOSIS — I509 Heart failure, unspecified: Secondary | ICD-10-CM

## 2019-05-12 DIAGNOSIS — J449 Chronic obstructive pulmonary disease, unspecified: Secondary | ICD-10-CM

## 2019-05-12 DIAGNOSIS — E8809 Other disorders of plasma-protein metabolism, not elsewhere classified: Secondary | ICD-10-CM

## 2019-05-12 DIAGNOSIS — I10 Essential (primary) hypertension: Secondary | ICD-10-CM

## 2019-05-12 DIAGNOSIS — R609 Edema, unspecified: Secondary | ICD-10-CM

## 2019-05-12 DIAGNOSIS — R0609 Other forms of dyspnea: Secondary | ICD-10-CM

## 2019-05-12 DIAGNOSIS — R252 Cramp and spasm: Secondary | ICD-10-CM | POA: Insufficient documentation

## 2019-05-12 MED ORDER — LISINOPRIL 40 MG PO TABS: 40.0000 mg | ORAL_TABLET | Freq: Every day | ORAL | 0 refills | Status: DC

## 2019-05-12 MED ORDER — ASPIRIN EC 81 MG PO TBEC: 81.0000 mg | DELAYED_RELEASE_TABLET | Freq: Every day | ORAL | 3 refills | Status: DC

## 2019-05-12 MED ORDER — FUROSEMIDE 40 MG PO TABS: 40.0000 mg | ORAL_TABLET | Freq: Two times a day (BID) | ORAL | 1 refills | Status: DC

## 2019-05-12 MED ORDER — ATORVASTATIN CALCIUM 20 MG PO TABS: 20.0000 mg | ORAL_TABLET | Freq: Every day | ORAL | 3 refills | Status: DC

## 2019-05-12 MED ORDER — CARVEDILOL 25 MG PO TABS: 25.0000 mg | ORAL_TABLET | Freq: Two times a day (BID) | ORAL | 1 refills | Status: DC

## 2019-05-12 MED ORDER — SPIRONOLACTONE 50 MG PO TABS: 50.0000 mg | ORAL_TABLET | Freq: Every day | ORAL | 1 refills | Status: DC

## 2019-05-12 MED ORDER — BUDESONIDE-FORMOTEROL FUMARATE 160-4.5 MCG/ACT IN AERO: 2.0000 | INHALATION_SPRAY | Freq: Two times a day (BID) | RESPIRATORY_TRACT | 3 refills | Status: DC

## 2019-05-12 MED ORDER — AMLODIPINE BESYLATE 10 MG PO TABS: 5.0000 mg | ORAL_TABLET | Freq: Every day | ORAL | 0 refills | Status: DC

## 2019-05-12 NOTE — Patient Instructions (Signed)
STOP coumadin! Let me know if cramping gets worse Labs sometime next month or so to monitor kidney on new medication dose. Doesn't need to be fasting!

## 2019-05-12 NOTE — Progress Notes (Signed)
HPI: Ronnie Bowman is a 63 y.o. male who  has a past medical history of Acute pulmonary embolism (HCC), CHF (congestive heart failure) (HCC), COPD (chronic obstructive pulmonary disease) (HCC), and Hypertension.  he presents to [facility] today, 05/12/19,  for chief complaint of:  Follow-up chronic issues: COPD, CHF, HTN, Hx PE Needs INR checked Hand cramping    CARDIOVASCULAR Hx: History of CHF and poorly controlled hypertension good control today.  History of pulmonary embolus for which he is taking Coumadin.  PE was diagnosed about 2 years ago, spontaneous. No records available but pt denies other VTE history.  Meds: Amlodipine 5 mg daily, atorvastatin 20 mg daily, Coreg 25 mg twice daily, lisinopril 40 mg daily, spironolactone 50 mg daily, Lasix 40 mg 1-2 times daily, Coumadin History of CHF, most recent echocardiogram 04/28/2019.  EF 60 to 65%, no LVH, mild diastolic dysfunction Males: AAA screening if smoker? Will discuss at annual physical   RESPIRATORY Hx: COPD Meds: Symbicort 160- 4.52 puffs twice daily Tobacco history: quit 2 years ago.  Lung cancer screening? Finances preclude at this time   GASTROINTESTINAL Colon cancer screening: finances preclude at this time   MUSCULOSKELETAL/RHEUM New problem today: Describes cramping in hands and ankles, worse in hands.  Most recent labs reviewed from 04/21/2019 about 3 weeks ago, normal non-fasting CBC/CMP except for slightly decreased A/G ratio, slightly increased globulin at 4.1.        At today's visit 05/12/19 ... PMH, PSH, FH reviewed and updated as needed.  Current medication list and allergy/intolerance hx reviewed and updated as needed. (See remainder of HPI, ROS, Phys Exam below)   No results found.  No results found for this or any previous visit (from the past 72 hour(s)).        ASSESSMENT/PLAN: The primary encounter diagnosis was Hypertension goal BP (blood pressure) <  130/80. Diagnoses of Cramping of hands and Chronic obstructive pulmonary disease, unspecified COPD type (HCC) were also pertinent to this visit.    Given idiopathic nature of pulmonary embolism, could certainly consider lifelong anticoagulation though patient has minimized risk factors including has good control blood pressure at this point, quit smoking.  Patient would like to try coming off anticoagulation if it is not absolutely necessary and I think this is reasonable. Will start ASA.   Patient has ultrasound coming up to evaluate abdominal distention   Orders Placed This Encounter  Procedures  . BASIC METABOLIC PANEL WITH GFR    No orders of the defined types were placed in this encounter.  Patient Instructions  STOP coumadin! Let me know if cramping gets worse Labs sometime next month or so to monitor kidney on new medication dose. Doesn't need to be fasting!                      ################################################# ################################################# ################################################# #################################################    No outpatient medications have been marked as taking for the 05/12/19 encounter (Appointment) with Sunnie Nielsen, DO.    No Known Allergies     Review of Systems:  Constitutional: No recent illness  HEENT: No  headache, no vision change  Cardiac: No  chest pain, No  pressure, No palpitations  Respiratory:  No  shortness of breath. No  Cough  Gastrointestinal: No  abdominal pain, no change on bowel habits  Musculoskeletal: No new myalgia/arthralgia  Skin: No  Rash  Neurologic: No  weakness, No  Dizziness  Psychiatric: No  concerns with depression, No  concerns with anxiety  Exam:  BP 110/68 (BP Location: Left Arm, Patient Position: Sitting, Cuff Size: Normal)   Pulse (!) 54   Temp 98.1 F (36.7 C) (Oral)   Wt 202 lb (91.6 kg)   BMI 29.83 kg/m    Constitutional: VS see above. General Appearance: alert, well-developed, well-nourished, NAD  Neck: No masses, trachea midline.   Respiratory: Normal respiratory effort. no wheeze, no rhonchi, no rales  Cardiovascular: S1/S2 normal, no murmur, no rub/gallop auscultated. RRR.   Musculoskeletal: Gait normal. Symmetric and independent movement of all extremities  Neurological: Normal balance/coordination. No tremor.  Skin: warm, dry, intact.   Psychiatric: Normal judgment/insight. Normal mood and affect. Oriented x3.       Visit summary with medication list and pertinent instructions was printed for patient to review, patient was advised to alert [place] if any updates are needed. All questions at time of visit were answered - patient instructed to contact office with any additional concerns. ER/RTC precautions were reviewed with the patient and understanding verbalized.      Please note: voice recognition software was used to produce this document, and typos may escape review. Please contact Dr. Sheppard Coil for any needed clarifications.    Follow up plan: Return in about 6 months (around 11/10/2019) for [place] (call week prior to visit for lab orders).

## 2019-06-02 ENCOUNTER — Ambulatory Visit: Payer: Self-pay | Admitting: Osteopathic Medicine

## 2019-07-14 ENCOUNTER — Other Ambulatory Visit: Payer: Self-pay | Admitting: Physician Assistant

## 2019-08-12 ENCOUNTER — Other Ambulatory Visit: Payer: Self-pay | Admitting: Physician Assistant

## 2019-08-12 DIAGNOSIS — I10 Essential (primary) hypertension: Secondary | ICD-10-CM

## 2019-08-14 ENCOUNTER — Telehealth: Payer: Self-pay

## 2019-08-14 NOTE — Telephone Encounter (Signed)
We received a fax from [facility] stating patient is taking half of a tablet of the 10 mg of Amlodipine. They state he is reporting low blood pressure. I called and left a message for him to call us back with blood pressure readings.

## 2019-08-14 NOTE — Telephone Encounter (Signed)
Pt returned a call back with blood pressure readings. As per pt, blood pressure ranging from 100/60 - 129/82. Pt has been w/o amlodipine rx for 2 wks. Wants to know whether he should continue taking or stop taking medication. Pls advise, thanks.

## 2019-08-14 NOTE — Telephone Encounter (Signed)
If he has been off of the medication entirely and his blood pressures are less than 130/80 for the most part, he can stay off the medication and we can take it off his list.  129/82 is borderline but if they are usually below that, I am not too worried about it

## 2019-08-15 NOTE — Telephone Encounter (Signed)
Left message for a return call

## 2019-08-16 NOTE — Telephone Encounter (Signed)
Pt left a vm msg stating he has not received a call back regarding amlodipine medication. Attempted to contact patient, no answer. Left a detailed vm msg regarding provider's note. Direct call back info provided.

## 2019-10-03 ENCOUNTER — Other Ambulatory Visit: Payer: Self-pay | Admitting: Osteopathic Medicine

## 2019-11-14 ENCOUNTER — Other Ambulatory Visit: Payer: Self-pay

## 2019-11-14 MED ORDER — FUROSEMIDE 40 MG PO TABS: 40.0000 mg | ORAL_TABLET | Freq: Two times a day (BID) | ORAL | 0 refills | Status: DC

## 2020-01-31 IMAGING — DX DG CHEST 2V
2 series · 2 of 2 positions shown · non-contrast
Comparison: Radiographs 04/04/2018.

CLINICAL DATA: Shortness of breath for 2 months. History of COPD
and hypertension.

EXAM:
CHEST - 2 VIEW

[chest pa]
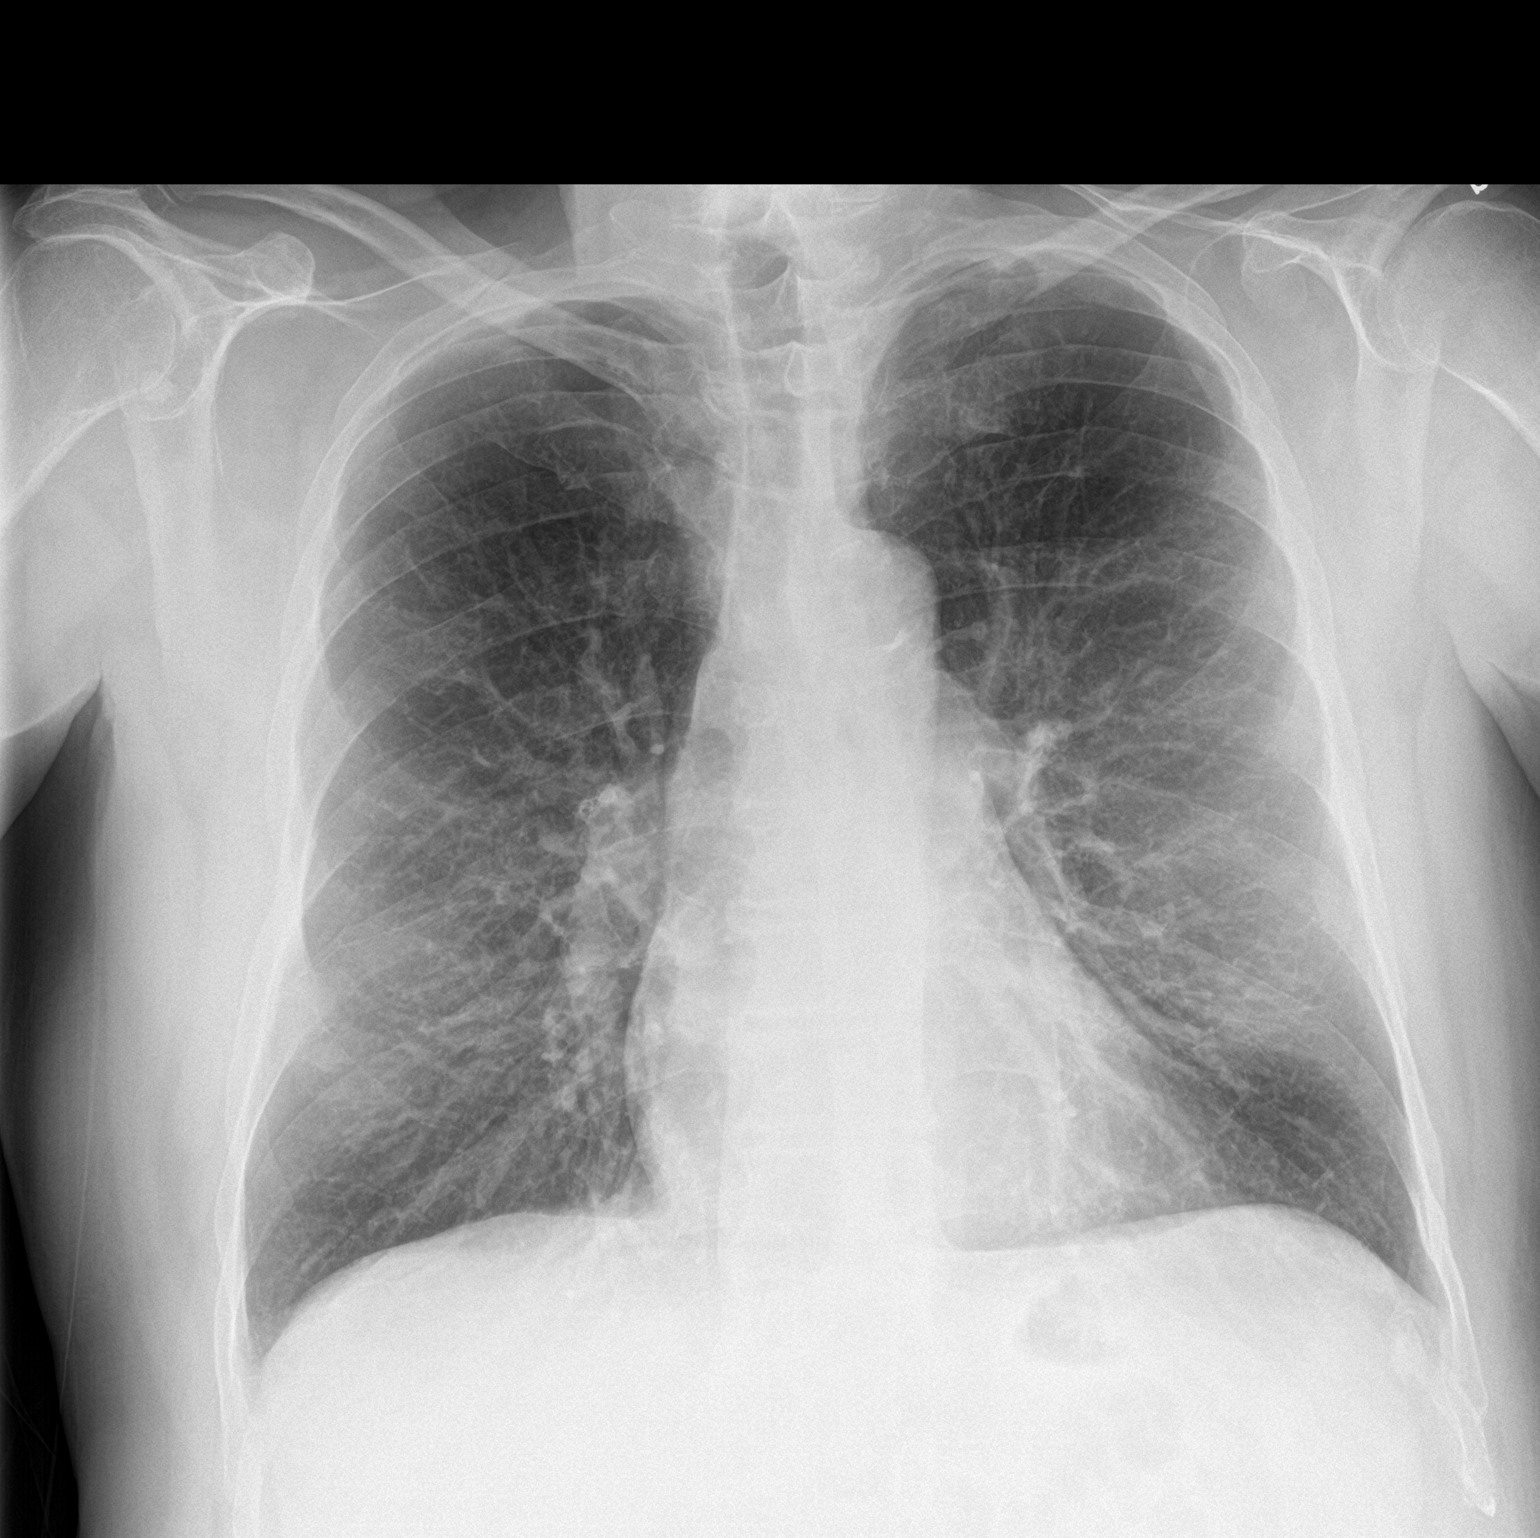

[chest lat]
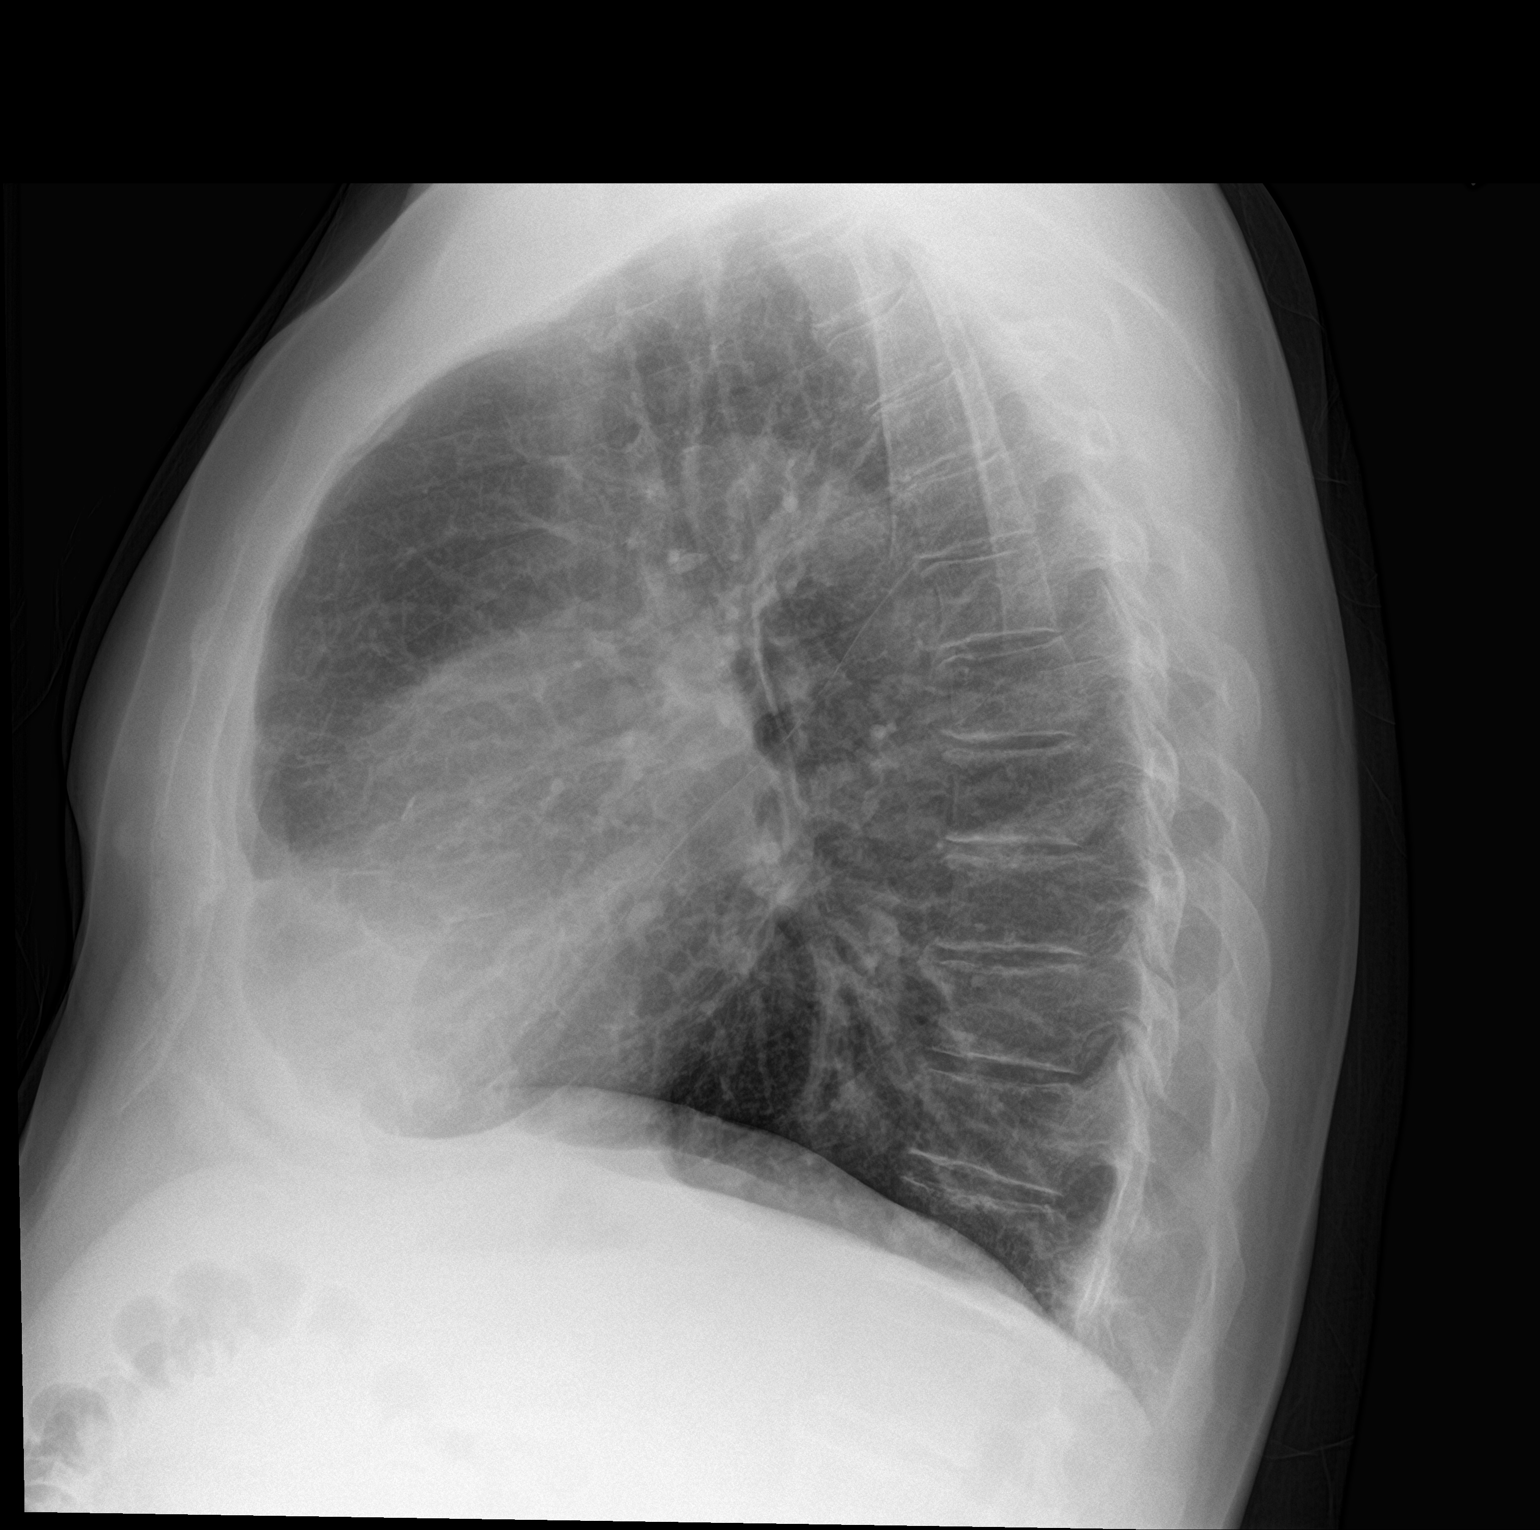

[2 of 2 positions shown; findings below may reference images not displayed]

FINDINGS: The heart size and mediastinal contours are stable. There is mild
aortic atherosclerosis. The lungs are hyperinflated with mild
central airway thickening consistent with COPD/bronchitis. No
airspace disease, pleural effusion or pneumothorax. The bones appear
unremarkable.
IMPRESSION: Chronic obstructive pulmonary disease. No acute cardiopulmonary
process.

## 2020-02-21 IMAGING — US US ABDOMEN COMPLETE
1 series · 13 of 25 positions shown · non-contrast
Comparison: None.

CLINICAL DATA: Hyperalbuminemia

EXAM:
ABDOMEN ULTRASOUND COMPLETE

[Series 1: us abdomen complete · 0.20mm/px · 13 of 176 slices shown]
[im 1/176]
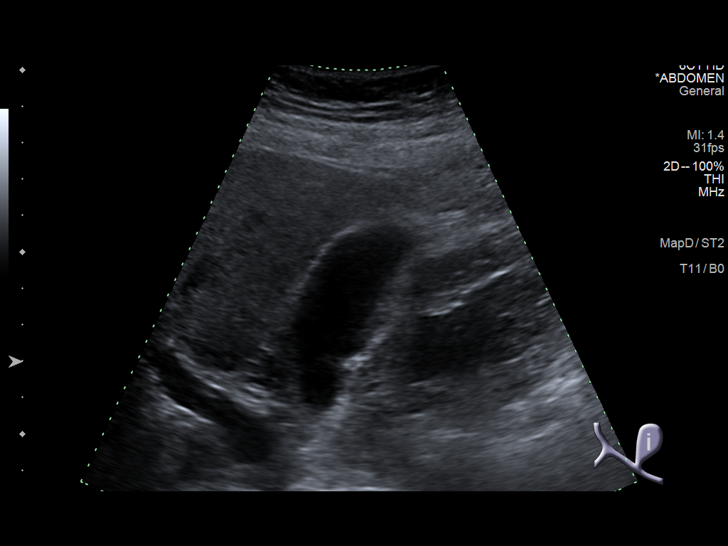
[im 15/176]
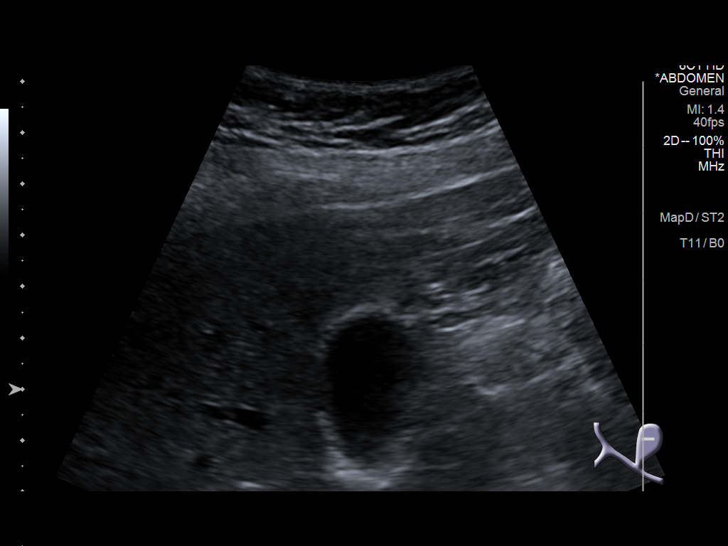
[im 30/176]
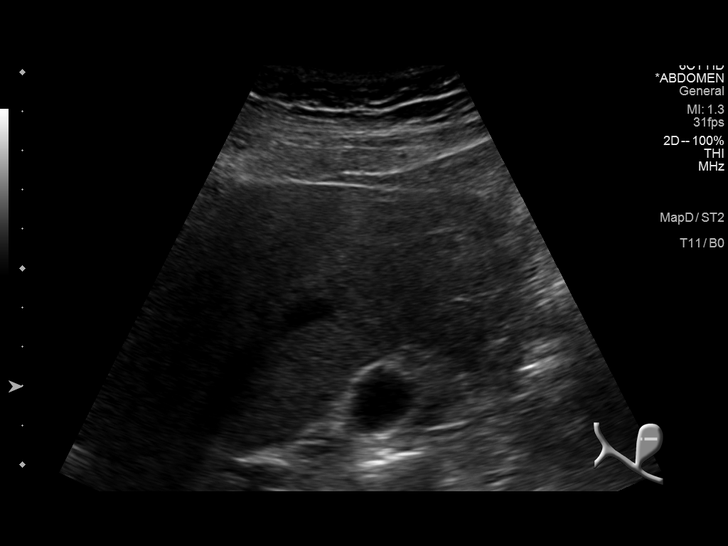
[im 44/176]
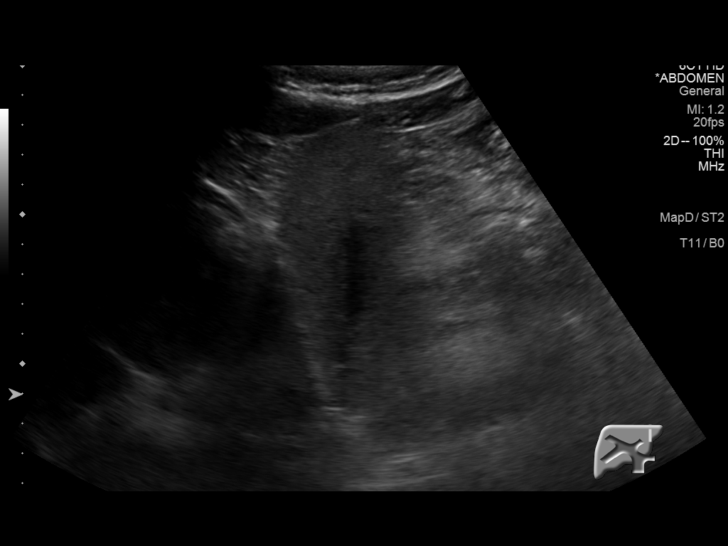
[im 59/176]
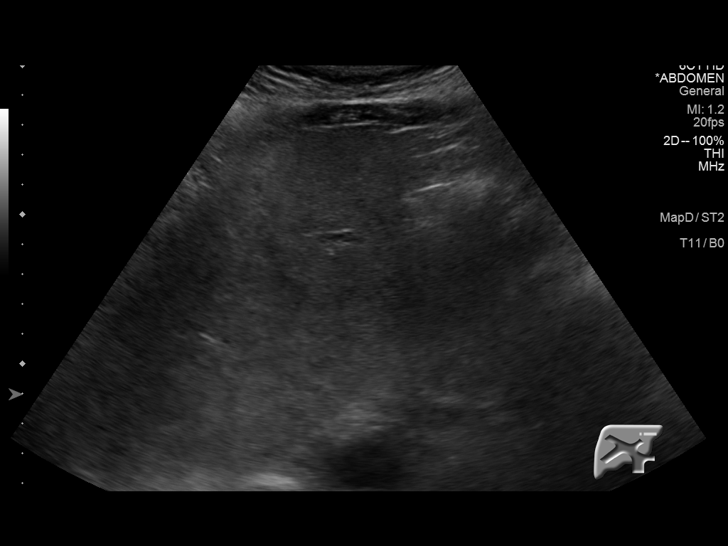
[im 73/176]
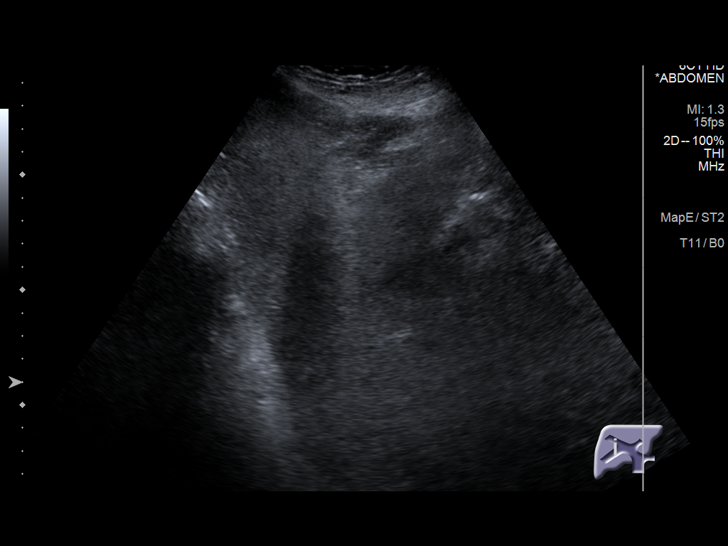
[im 88/176]
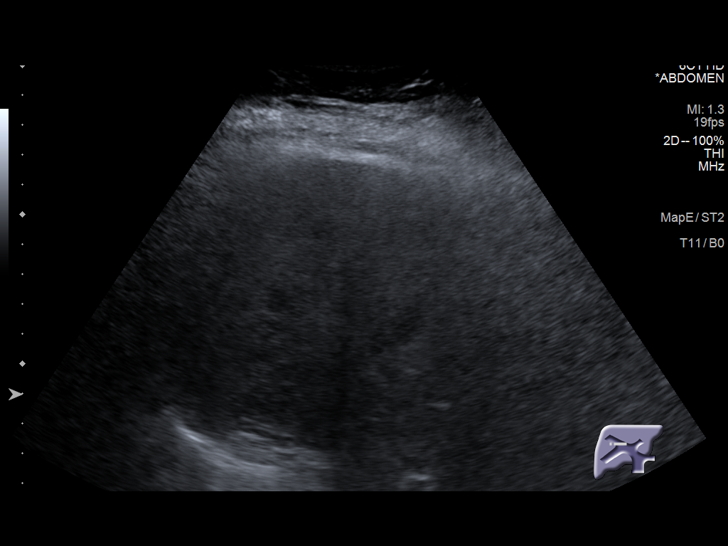
[im 103/176]
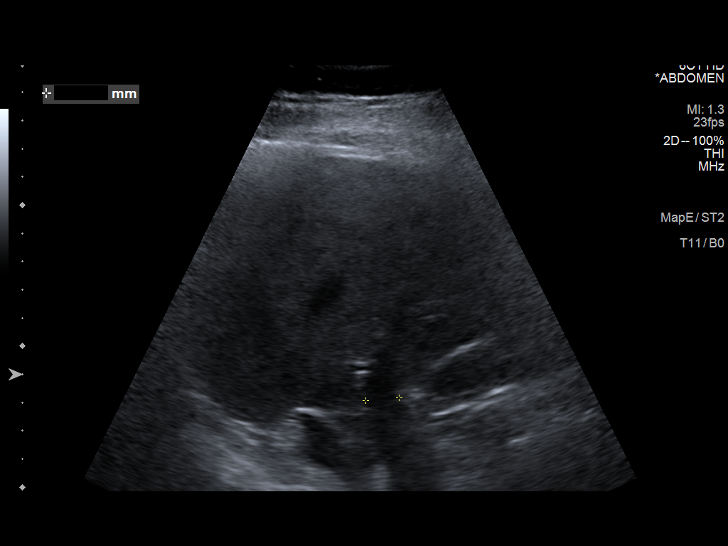
[im 117/176]
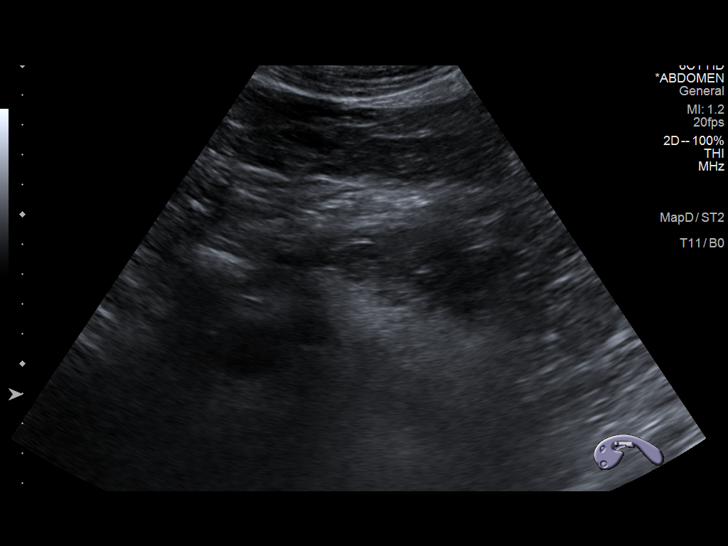
[im 132/176]
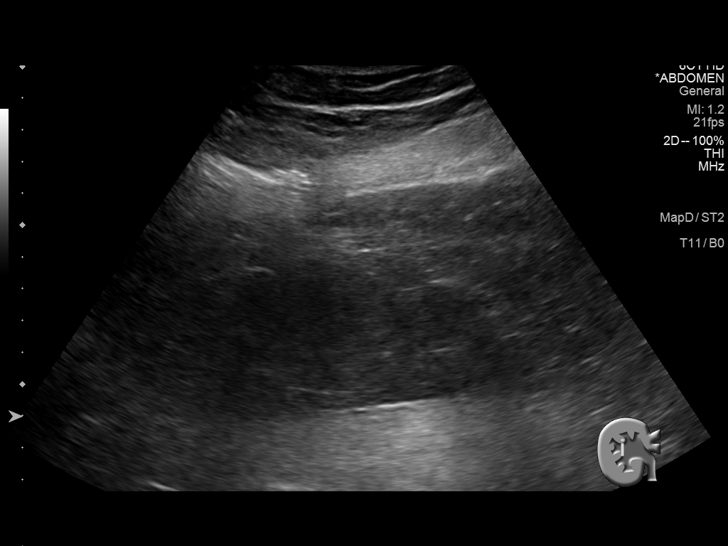
[im 146/176]
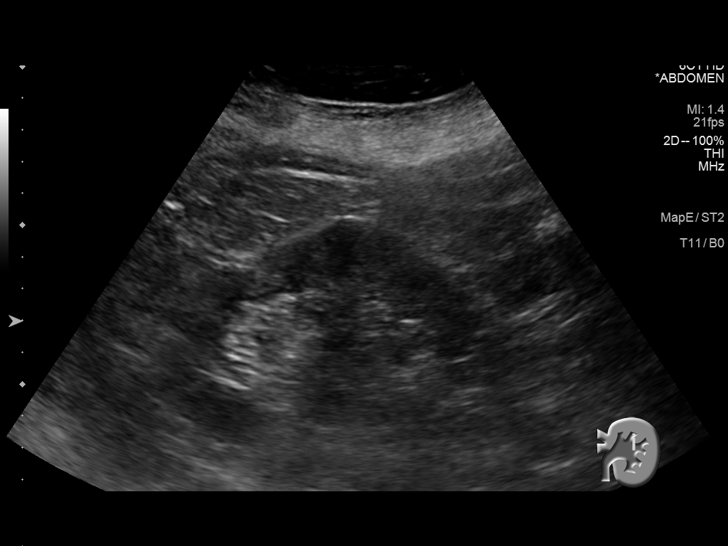
[im 161/176]
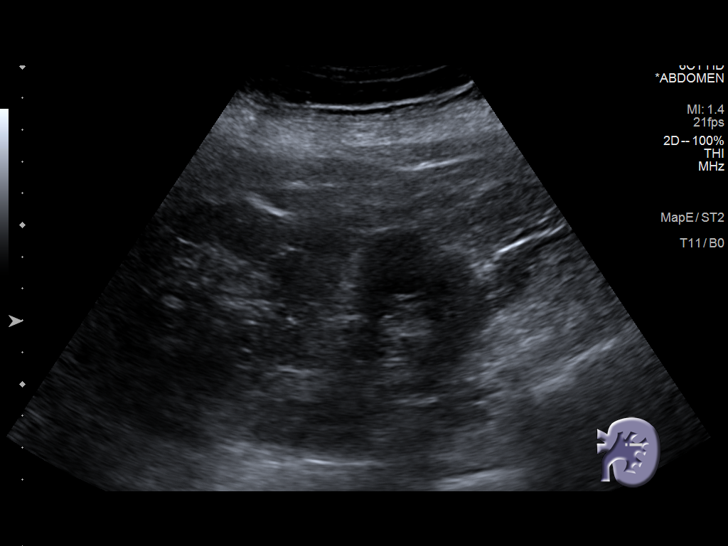
[im 176/176]
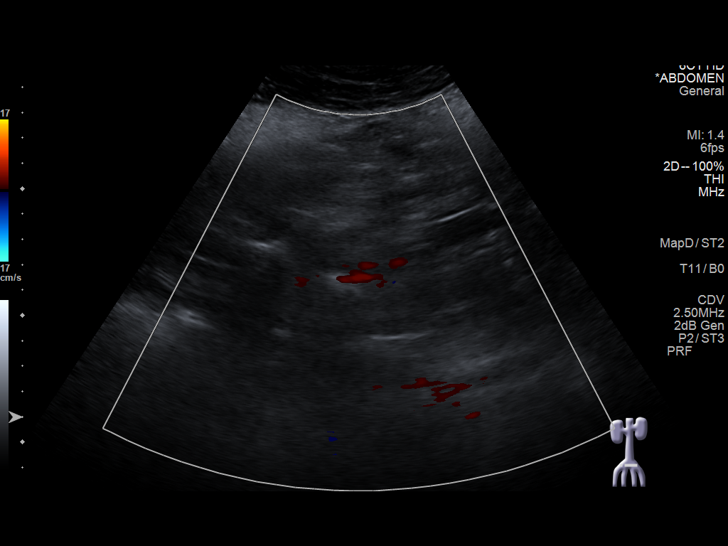

[13 of 25 positions shown; findings below may reference images not displayed]

FINDINGS: Gallbladder: No gallstones or wall thickening visualized. No
sonographic Murphy sign noted by sonographer.

Common bile duct: Not clearly visualized.

Liver: No focal lesion identified. Increased hepatic parenchymal
echogenicity. Portal vein is patent on color Doppler imaging with
normal direction of blood flow towards the liver.

IVC: No abnormality visualized.

Pancreas: Not well evaluated secondary to shadowing from adjacent
bowel gas.

Spleen: Size and appearance within normal limits.

Right Kidney: Length: 12.2 cm. Echogenicity within normal limits. No
mass or hydronephrosis visualized.

Left Kidney: Length: 11.1 cm. Echogenicity within normal limits. No
mass or hydronephrosis visualized.

Abdominal aorta: Measures 2.7 cm proximally. The mid and distal
aspects are not well visualized.

Other findings: No ascites.
IMPRESSION: 1. The echogenicity of the liver is increased. This is a nonspecific
finding but is most commonly seen with fatty infiltration of the
liver. There are no obvious focal liver lesions.
2. No ascites.
3. Ectatic abdominal aorta at risk for aneurysm development.
Recommend followup by ultrasound in 5 years. This recommendation
follows ACR consensus guidelines: White Paper of the ACR Incidental
[DATE]. Aortic aneurysm NOS (JMNIF-DPD.R)

## 2020-02-26 ENCOUNTER — Other Ambulatory Visit: Payer: Self-pay

## 2020-02-26 DIAGNOSIS — I10 Essential (primary) hypertension: Secondary | ICD-10-CM

## 2020-02-26 MED ORDER — LISINOPRIL 40 MG PO TABS: 40.0000 mg | ORAL_TABLET | Freq: Every day | ORAL | 0 refills | Status: DC

## 2020-03-25 ENCOUNTER — Other Ambulatory Visit: Payer: Self-pay | Admitting: Osteopathic Medicine

## 2020-03-25 DIAGNOSIS — I10 Essential (primary) hypertension: Secondary | ICD-10-CM

## 2020-05-03 ENCOUNTER — Other Ambulatory Visit: Payer: Self-pay | Admitting: Osteopathic Medicine

## 2020-05-03 DIAGNOSIS — I10 Essential (primary) hypertension: Secondary | ICD-10-CM

## 2020-06-04 ENCOUNTER — Other Ambulatory Visit: Payer: Self-pay | Admitting: Osteopathic Medicine

## 2020-06-04 DIAGNOSIS — I10 Essential (primary) hypertension: Secondary | ICD-10-CM

## 2020-07-08 ENCOUNTER — Telehealth: Payer: Self-pay

## 2020-07-08 MED ORDER — AMLODIPINE BESYLATE 10 MG PO TABS: 5.0000 mg | ORAL_TABLET | Freq: Every day | ORAL | 0 refills | Status: DC

## 2020-07-08 NOTE — Telephone Encounter (Signed)
Ronnie Bowman called and reports elevated blood pressure. He states he stopped the amlodipine almost a year ago due to low blood pressure. He states his blood pressure has been high for a month. He has been schedule for an appointment tomorrow. He would like a refill on the amlodipine.   Advised to go to the ED if he starts having headaches, shortness of breath or chest pain.

## 2020-07-09 ENCOUNTER — Ambulatory Visit (INDEPENDENT_AMBULATORY_CARE_PROVIDER_SITE_OTHER): Payer: 59 | Admitting: Osteopathic Medicine

## 2020-07-09 ENCOUNTER — Other Ambulatory Visit: Payer: Self-pay

## 2020-07-09 ENCOUNTER — Ambulatory Visit (INDEPENDENT_AMBULATORY_CARE_PROVIDER_SITE_OTHER): Payer: 59

## 2020-07-09 ENCOUNTER — Encounter: Payer: Self-pay | Admitting: Osteopathic Medicine

## 2020-07-09 VITALS — BP 141/65 | HR 85 | Temp 98.1°F | Wt 189.0 lb

## 2020-07-09 DIAGNOSIS — R059 Cough, unspecified: Secondary | ICD-10-CM

## 2020-07-09 DIAGNOSIS — J439 Emphysema, unspecified: Secondary | ICD-10-CM | POA: Diagnosis not present

## 2020-07-09 DIAGNOSIS — Z125 Encounter for screening for malignant neoplasm of prostate: Secondary | ICD-10-CM

## 2020-07-09 DIAGNOSIS — R0602 Shortness of breath: Secondary | ICD-10-CM | POA: Diagnosis not present

## 2020-07-09 DIAGNOSIS — I10 Essential (primary) hypertension: Secondary | ICD-10-CM

## 2020-07-09 DIAGNOSIS — Z0000 Encounter for general adult medical examination without abnormal findings: Secondary | ICD-10-CM

## 2020-07-09 DIAGNOSIS — Z20822 Contact with and (suspected) exposure to covid-19: Secondary | ICD-10-CM

## 2020-07-09 DIAGNOSIS — J449 Chronic obstructive pulmonary disease, unspecified: Secondary | ICD-10-CM

## 2020-07-09 MED ORDER — CARVEDILOL 25 MG PO TABS
25.0000 mg | ORAL_TABLET | Freq: Two times a day (BID) | ORAL | 1 refills | Status: DC
Start: 2020-07-09 — End: 2021-01-06

## 2020-07-09 MED ORDER — PREDNISONE 20 MG PO TABS: 20.0000 mg | ORAL_TABLET | Freq: Two times a day (BID) | ORAL | 0 refills | Status: DC

## 2020-07-09 MED ORDER — ATORVASTATIN CALCIUM 20 MG PO TABS: 20.0000 mg | ORAL_TABLET | Freq: Every day | ORAL | 3 refills | Status: DC

## 2020-07-09 MED ORDER — PREDNISONE 20 MG PO TABS
20.0000 mg | ORAL_TABLET | Freq: Two times a day (BID) | ORAL | 0 refills | Status: DC
Start: 2020-07-09 — End: 2020-07-18

## 2020-07-09 MED ORDER — AMLODIPINE BESYLATE 10 MG PO TABS: 10.0000 mg | ORAL_TABLET | Freq: Every day | ORAL | 1 refills | Status: DC

## 2020-07-09 MED ORDER — FUROSEMIDE 20 MG PO TABS: 20.0000 mg | ORAL_TABLET | Freq: Two times a day (BID) | ORAL | 1 refills | Status: DC | PRN

## 2020-07-09 MED ORDER — ALBUTEROL SULFATE HFA 108 (90 BASE) MCG/ACT IN AERS: 2.0000 | INHALATION_SPRAY | Freq: Four times a day (QID) | RESPIRATORY_TRACT | 11 refills | Status: DC | PRN

## 2020-07-09 MED ORDER — SPIRONOLACTONE 50 MG PO TABS: 50.0000 mg | ORAL_TABLET | Freq: Every day | ORAL | 1 refills | Status: DC

## 2020-07-09 MED ORDER — PREDNISONE 20 MG PO TABS
20.0000 mg | ORAL_TABLET | Freq: Two times a day (BID) | ORAL | 0 refills | Status: DC
Start: 2020-07-09 — End: 2020-07-09

## 2020-07-09 MED ORDER — BUDESONIDE-FORMOTEROL FUMARATE 160-4.5 MCG/ACT IN AERO: 2.0000 | INHALATION_SPRAY | Freq: Two times a day (BID) | RESPIRATORY_TRACT | 11 refills | Status: DC

## 2020-07-09 MED ORDER — LISINOPRIL 40 MG PO TABS
40.0000 mg | ORAL_TABLET | Freq: Every day | ORAL | 1 refills | Status: DC
Start: 2020-07-09 — End: 2020-12-31

## 2020-07-09 NOTE — Patient Instructions (Addendum)
Plan:   Treat for COPD exacerbation   continue Symbicort twice daily with Albuterol every few days as needed  start Prednisone steroid burst.   Will get Xray today.   Will test for COVID today.    Labs today for medication safety monitoring   Blood pressure goal 130/80 or less  Restart ALL medications for blood pressure and heart   Amlodipine 10 mg daily for blood pressure  Atorvastatin 20 mg daily for cholesterol and prevent heart attack / stroke  Carvedilol 25 mg twice daily for blood pressure and heart   Lisinopril 40 mg daily for blood pressure and heart   Spironolactone 50 mg daily for blood pressure and heart   Furosemide 20 mg AS NEEDED twice per day for swelling, weight gain signs of heart failure

## 2020-07-09 NOTE — Progress Notes (Signed)
Ronnie Bowman is a 64 y.o. male who presents to  [facility]  today, 07/09/20, seeking care for the following:  . Concern for COPD exacerbation, has noted more SOB lately, SaO2 as below on RA. Has been using inhaler.  Marland Kitchen HTN - reports high readings at home, he stopped amlodipine about a year ago d/t good BP at that time (110/68).  Marland Kitchen Hx CHF - ASA, Statin, BB, Lasix, ACEI Spironolactone . Hx PE - 3 years ago, off anticoagulation about 1 year    BP Readings from Last 3 Encounters:  07/09/20 (!) 141/65  05/12/19 110/68  04/28/19 126/70      ASSESSMENT & PLAN with other pertinent findings:  The primary encounter diagnosis was Cough. Diagnoses of Suspected COVID-19 virus infection, Shortness of breath, Hypertension goal BP (blood pressure) < 130/80, Chronic obstructive pulmonary disease, unspecified COPD type (HCC), Annual physical exam, Essential hypertension, and Prostate cancer screening were also pertinent to this visit.   No results found for this or any previous visit (from the past 24 hour(s)).     Patient Instructions  Plan:   Treat for COPD exacerbation   continue Symbicort twice daily with Albuterol every few days as needed  start Prednisone steroid burst.   Will get Xray today.   Will test for COVID today.    Labs today for medication safety monitoring   Blood pressure goal 130/80 or less  Restart ALL medications for blood pressure and heart   Amlodipine 10 mg daily for blood pressure  Atorvastatin 20 mg daily for cholesterol and prevent heart attack / stroke  Carvedilol 25 mg twice daily for blood pressure and heart   Lisinopril 40 mg daily for blood pressure and heart   Spironolactone 50 mg daily for blood pressure and heart   Furosemide 20 mg AS NEEDED twice per day for swelling, weight gain signs of heart failure      Orders Placed This Encounter  Procedures  . SARS-COV-2, NAA 2 DAY TAT   . DG Chest 2 View  . CBC with Differential/Platelet  . COMPLETE METABOLIC PANEL WITH GFR  . Lipid panel  . PSA, Total with Reflex to PSA, Free    Meds ordered this encounter  Medications  . amLODipine (NORVASC) 10 MG tablet    Sig: Take 1 tablet (10 mg total) by mouth daily.    Dispense:  90 tablet    Refill:  1  . atorvastatin (LIPITOR) 20 MG tablet    Sig: Take 1 tablet (20 mg total) by mouth at bedtime.    Dispense:  90 tablet    Refill:  3  . budesonide-formoterol (SYMBICORT) 160-4.5 MCG/ACT inhaler    Sig: Inhale 2 puffs into the lungs 2 (two) times daily.    Dispense:  30.6 g    Refill:  11  . carvedilol (COREG) 25 MG tablet    Sig: Take 1 tablet (25 mg total) by mouth 2 (two) times daily with a meal.    Dispense:  180 tablet    Refill:  1  . furosemide (LASIX) 20 MG tablet    Sig: Take 1 tablet (20 mg total) by mouth 2 (two) times daily as needed. Weight gain 5 or more pounds lbs in 3 days or lower extremity swelling    Dispense:  180 tablet    Refill:  1  . lisinopril (ZESTRIL) 40 MG tablet    Sig: Take 1 tablet (40 mg total)  by mouth daily.    Dispense:  90 tablet    Refill:  1  . spironolactone (ALDACTONE) 50 MG tablet    Sig: Take 1 tablet (50 mg total) by mouth daily.    Dispense:  90 tablet    Refill:  1  . DISCONTD: predniSONE (DELTASONE) 20 MG tablet    Sig: Take 1 tablet (20 mg total) by mouth 2 (two) times daily with a meal.    Dispense:  10 tablet    Refill:  0  . albuterol (VENTOLIN HFA) 108 (90 Base) MCG/ACT inhaler    Sig: Inhale 2 puffs into the lungs every 6 (six) hours as needed for wheezing.    Dispense:  2 each    Refill:  11  . DISCONTD: predniSONE (DELTASONE) 20 MG tablet    Sig: Take 1 tablet (20 mg total) by mouth 2 (two) times daily with a meal.    Dispense:  10 tablet    Refill:  0  . predniSONE (DELTASONE) 20 MG tablet    Sig: Take 1 tablet (20 mg total) by mouth 2 (two) times daily with a meal.    Dispense:  10 tablet    Refill:   0       Follow-up instructions: Return in about 1 week (around 07/16/2020) for nurse visit recheck blood pressure back on medications .                                         BP (!) 141/65 (BP Location: Left Arm, Patient Position: Sitting, Cuff Size: Normal)   Pulse 85   Temp 98.1 F (36.7 C) (Oral)   Wt 189 lb (85.7 kg)   SpO2 (!) 89%   BMI 27.91 kg/m   Current Meds  Medication Sig  . AMBULATORY NON FORMULARY MEDICATION Take 2 each by mouth every morning. Medication Name: [place] gummies  . amLODipine (NORVASC) 10 MG tablet Take 1 tablet (10 mg total) by mouth daily.  Marland Kitchen. aspirin EC 81 MG tablet Take 1 tablet (81 mg total) by mouth daily.  Marland Kitchen. atorvastatin (LIPITOR) 20 MG tablet Take 1 tablet (20 mg total) by mouth at bedtime.  . budesonide-formoterol (SYMBICORT) 160-4.5 MCG/ACT inhaler Inhale 2 puffs into the lungs 2 (two) times daily.  . carvedilol (COREG) 25 MG tablet Take 1 tablet (25 mg total) by mouth 2 (two) times daily with a meal.  . furosemide (LASIX) 20 MG tablet Take 1 tablet (20 mg total) by mouth 2 (two) times daily as needed. Weight gain 5 or more pounds lbs in 3 days or lower extremity swelling  . lisinopril (ZESTRIL) 40 MG tablet Take 1 tablet (40 mg total) by mouth daily.  . Melatonin 10 MG CAPS Take 10 mg by mouth.  . spironolactone (ALDACTONE) 50 MG tablet Take 1 tablet (50 mg total) by mouth daily.  . [DISCONTINUED] amLODipine (NORVASC) 10 MG tablet Take 0.5 tablets (5 mg total) by mouth daily.  . [DISCONTINUED] atorvastatin (LIPITOR) 20 MG tablet Take 1 tablet (20 mg total) by mouth at bedtime.  . [DISCONTINUED] budesonide-formoterol (SYMBICORT) 160-4.5 MCG/ACT inhaler Inhale 2 puffs into the lungs 2 (two) times daily.  . [DISCONTINUED] carvedilol (COREG) 25 MG tablet Take 1 tablet (25 mg total) by mouth 2 (two) times daily with a meal.  . [DISCONTINUED] furosemide (LASIX) 40 MG tablet Take 1 tablet (40 mg total) by mouth 2  (  two) times daily.  . [DISCONTINUED] lisinopril (ZESTRIL) 40 MG tablet Take 1 tablet by mouth once daily  . [DISCONTINUED] spironolactone (ALDACTONE) 50 MG tablet Take 1 tablet (50 mg total) by mouth daily.    Results for orders placed or performed in visit on 07/09/20 (from the past 72 hour(s))  CBC with Differential/Platelet     Status: Abnormal   Collection Time: 07/09/20 12:00 AM  Result Value Ref Range   WBC 16.3 (H) 3.8 - 10.8 Thousand/uL   RBC 4.90 4.20 - 5.80 Million/uL   Hemoglobin 17.5 (H) 13.2 - 17.1 g/dL   HCT 17.5 10.2 - 58.5 %   MCV 99.8 80.0 - 100.0 fL   MCH 35.7 (H) 27.0 - 33.0 pg   MCHC 35.8 32.0 - 36.0 g/dL   RDW 27.7 82.4 - 23.5 %   Platelets 247 140 - 400 Thousand/uL   MPV 12.5 7.5 - 12.5 fL   Neutro Abs 11,769 (H) 1,500 - 7,800 cells/uL   Lymphs Abs 2,478 850 - 3,900 cells/uL   Absolute Monocytes 1,728 (H) 200 - 950 cells/uL   Eosinophils Absolute 228 15 - 500 cells/uL   Basophils Absolute 98 0 - 200 cells/uL   Neutrophils Relative % 72.2 %   Total Lymphocyte 15.2 %   Monocytes Relative 10.6 %   Eosinophils Relative 1.4 %   Basophils Relative 0.6 %  COMPLETE METABOLIC PANEL WITH GFR     Status: Abnormal   Collection Time: 07/09/20 12:00 AM  Result Value Ref Range   Glucose, Bld 125 (H) 65 - 99 mg/dL    Comment: .            Fasting reference interval . For someone without known diabetes, a glucose value between 100 and 125 mg/dL is consistent with prediabetes and should be confirmed with a follow-up test. .    BUN 18 7 - 25 mg/dL   Creat 3.61 4.43 - 1.54 mg/dL    Comment: For patients >61 years of age, the reference limit for Creatinine is approximately 13% higher for people identified as African-American. .    GFR, Est Non African American 64 > OR = 60 mL/min/1.51m2   GFR, Est African American 75 > OR = 60 mL/min/1.17m2   BUN/Creatinine Ratio NOT APPLICABLE 6 - 22 (calc)   Sodium 139 135 - 146 mmol/L   Potassium 3.5 3.5 - 5.3 mmol/L    Chloride 99 98 - 110 mmol/L   CO2 28 20 - 32 mmol/L   Calcium 8.8 8.6 - 10.3 mg/dL   Total Protein 7.6 6.1 - 8.1 g/dL   Albumin 3.6 3.6 - 5.1 g/dL   Globulin 4.0 (H) 1.9 - 3.7 g/dL (calc)   AG Ratio 0.9 (L) 1.0 - 2.5 (calc)   Total Bilirubin 1.1 0.2 - 1.2 mg/dL   Alkaline phosphatase (APISO) 65 35 - 144 U/L   AST 12 10 - 35 U/L   ALT 12 9 - 46 U/L  Lipid panel     Status: Abnormal   Collection Time: 07/09/20 12:00 AM  Result Value Ref Range   Cholesterol 163 <200 mg/dL   HDL 60 > OR = 40 mg/dL   Triglycerides 008 (H) <150 mg/dL   LDL Cholesterol (Calc) 74 mg/dL (calc)    Comment: Reference range: <100 . Desirable range <100 mg/dL for primary prevention;   <70 mg/dL for patients with CHD or diabetic patients  with > or = 2 CHD risk factors. Marland Kitchen LDL-C is now calculated using the  Martin-Hopkins  calculation, which is a validated novel method providing  better accuracy than the Friedewald equation in the  estimation of LDL-C.  Horald Pollen et al. Lenox Ahr. 1914;782(95): 2061-2068  (http://education.[facility].com/faq/FAQ164)    Total CHOL/HDL Ratio 2.7 <5.0 (calc)   Non-HDL Cholesterol (Calc) 103 <130 mg/dL (calc)    Comment: For patients with diabetes plus 1 major ASCVD risk  factor, treating to a non-HDL-C goal of <100 mg/dL  (LDL-C of <62 mg/dL) is considered a therapeutic  option.   PSA, Total with Reflex to PSA, Free     Status: None   Collection Time: 07/09/20 12:00 AM  Result Value Ref Range   PSA, Total 1.5 < OR = 4.0 ng/mL    Comment: The Total PSA value from this assay system is  standardized against the equimolar PSA standard.  The test result will be approximately 20% higher  when compared to the [facility] Total PSA  (Siemens assay). Comparison of serial PSA results  should be interpreted with this fact in mind. Marland Kitchen PSA was performed using the Beckman Coulter  Immunoassay method. Values obtained from different  assay methods cannot be used interchangeably.  PSA  levels, regardless of value, should not be  interpreted as absolute evidence of the presence or  absence of disease.     DG Chest 2 View  Result Date: 07/10/2020 CLINICAL DATA:  COPD, SOB suspect exacerbation EXAM: CHEST - 2 VIEW COMPARISON:  04/21/2019 and prior. FINDINGS: Emphysematous changes. No pneumothorax or pleural effusion. No new focal consolidation. Stable cardiomediastinal silhouette. Multilevel spondylosis. IMPRESSION: Emphysema.  No new focal consolidation. Electronically Signed   By: Stana Bunting M.D.   On: 07/10/2020 16:01       All questions at time of visit were answered - patient instructed to contact office with any additional concerns or updates.  ER/RTC precautions were reviewed with the patient as applicable.   Please note: voice recognition software was used to produce this document, and typos may escape review. Please contact Dr. Lyn Hollingshead for any needed clarifications.

## 2020-07-10 LAB — COMPLETE METABOLIC PANEL WITH GFR
AG Ratio: 0.9 (calc) — ABNORMAL LOW (ref 1.0–2.5)
ALT: 12 U/L (ref 9–46)
AST: 12 U/L (ref 10–35)
Albumin: 3.6 g/dL (ref 3.6–5.1)
Alkaline phosphatase (APISO): 65 U/L (ref 35–144)
BUN: 18 mg/dL (ref 7–25)
CO2: 28 mmol/L (ref 20–32)
Calcium: 8.8 mg/dL (ref 8.6–10.3)
Chloride: 99 mmol/L (ref 98–110)
Creat: 1.2 mg/dL (ref 0.70–1.25)
GFR, Est African American: 75 mL/min/{1.73_m2} (ref >=60)
GFR, Est Non African American: 64 mL/min/{1.73_m2} (ref >=60)
Globulin: 4 g/dL (calc) — ABNORMAL HIGH (ref 1.9–3.7)
Glucose, Bld: 125 mg/dL — ABNORMAL HIGH (ref 65–99)
Potassium: 3.5 mmol/L (ref 3.5–5.3)
Sodium: 139 mmol/L (ref 135–146)
Total Bilirubin: 1.1 mg/dL (ref 0.2–1.2)
Total Protein: 7.6 g/dL (ref 6.1–8.1)

## 2020-07-10 LAB — CBC WITH DIFFERENTIAL/PLATELET
Absolute Monocytes: 1728 cells/uL — ABNORMAL HIGH (ref 200–950)
Basophils Absolute: 98 cells/uL (ref 0–200)
Basophils Relative: 0.6 %
Eosinophils Absolute: 228 cells/uL (ref 15–500)
Eosinophils Relative: 1.4 %
HCT: 48.9 % (ref 38.5–50.0)
Hemoglobin: 17.5 g/dL — ABNORMAL HIGH (ref 13.2–17.1)
Lymphs Abs: 2478 cells/uL (ref 850–3900)
MCH: 35.7 pg — ABNORMAL HIGH (ref 27.0–33.0)
MCHC: 35.8 g/dL (ref 32.0–36.0)
MCV: 99.8 fL (ref 80.0–100.0)
MPV: 12.5 fL (ref 7.5–12.5)
Monocytes Relative: 10.6 %
Neutro Abs: 11769 cells/uL — ABNORMAL HIGH (ref 1500–7800)
Neutrophils Relative %: 72.2 %
Platelets: 247 10*3/uL (ref 140–400)
RBC: 4.9 10*6/uL (ref 4.20–5.80)
RDW: 13 % (ref 11.0–15.0)
Total Lymphocyte: 15.2 %
WBC: 16.3 10*3/uL — ABNORMAL HIGH (ref 3.8–10.8)

## 2020-07-10 LAB — LIPID PANEL
Cholesterol: 163 mg/dL (ref <200)
HDL: 60 mg/dL (ref >=40)
LDL Cholesterol (Calc): 74 mg/dL (calc)
Non-HDL Cholesterol (Calc): 103 mg/dL (calc) (ref <130)
Total CHOL/HDL Ratio: 2.7 (calc) (ref <5.0)
Triglycerides: 196 mg/dL — ABNORMAL HIGH (ref <150)

## 2020-07-10 LAB — PSA, TOTAL WITH REFLEX TO PSA, FREE: PSA, Total: 1.5 ng/mL (ref <=4.0)

## 2020-07-11 ENCOUNTER — Telehealth: Payer: Self-pay

## 2020-07-11 MED ORDER — BENZONATATE 200 MG PO CAPS: 200.0000 mg | ORAL_CAPSULE | Freq: Three times a day (TID) | ORAL | 0 refills | Status: DC | PRN

## 2020-07-11 MED ORDER — NITROGLYCERIN 0.4 MG SL SUBL: 0.4000 mg | SUBLINGUAL_TABLET | SUBLINGUAL | 3 refills | Status: DC | PRN

## 2020-07-11 NOTE — Telephone Encounter (Signed)
Task completed. Pt has been updated of both rxs sent to the pharmacy. No other inquiries during the call.

## 2020-07-11 NOTE — Telephone Encounter (Signed)
Rxs sent

## 2020-07-11 NOTE — Telephone Encounter (Signed)
Pt called requesting tessalon and nitroglycerin rxs. Both medications are not on active med list. Per pt, he is still having coughing and nitroglycerin rx has expired. Pls send both rxs to [facility] pharmacy.

## 2020-07-13 LAB — SPECIMEN STATUS REPORT

## 2020-07-13 LAB — NOVEL CORONAVIRUS, NAA: SARS-CoV-2, NAA: NOT DETECTED

## 2020-07-13 LAB — SARS-COV-2, NAA 2 DAY TAT

## 2020-07-17 ENCOUNTER — Other Ambulatory Visit: Payer: Self-pay | Admitting: Osteopathic Medicine

## 2020-07-17 NOTE — Telephone Encounter (Signed)
Ronnie Bowman called and left a message for a refill on prednisone. He is still sick and felt better on the prednisone. Please advise.

## 2020-07-18 NOTE — Telephone Encounter (Signed)
Refilled but if still having problems he needs a follow-up visit

## 2020-08-28 ENCOUNTER — Telehealth: Payer: Self-pay

## 2020-08-28 NOTE — Telephone Encounter (Signed)
Pls contact patient to schedule next virtual/in-office appointment for an evaluation.  Pt called requesting if provider can send in a rx for Prednisone. Per pt, still continues to have coughing and congestion. Requesting for rx to be sent to [facility].

## 2020-08-29 NOTE — Telephone Encounter (Signed)
Appt made for tomorrow with Ronnie Bowman due to his work schedule to reevaluate sx

## 2020-08-30 ENCOUNTER — Telehealth (INDEPENDENT_AMBULATORY_CARE_PROVIDER_SITE_OTHER): Payer: 59 | Admitting: Nurse Practitioner

## 2020-08-30 ENCOUNTER — Encounter: Payer: Self-pay | Admitting: Nurse Practitioner

## 2020-08-30 DIAGNOSIS — R059 Cough, unspecified: Secondary | ICD-10-CM | POA: Diagnosis not present

## 2020-08-30 DIAGNOSIS — J449 Chronic obstructive pulmonary disease, unspecified: Secondary | ICD-10-CM | POA: Diagnosis not present

## 2020-08-30 MED ORDER — METHYLPREDNISOLONE 4 MG PO TBPK: ORAL_TABLET | ORAL | 0 refills | Status: DC

## 2020-08-30 MED ORDER — AZITHROMYCIN 250 MG PO TABS: ORAL_TABLET | ORAL | 0 refills | Status: AC

## 2020-08-30 NOTE — Progress Notes (Signed)
Pt stated it started  3-4 days ago having a cold stuffy nose coughing up flemm [facility]

## 2020-08-30 NOTE — Patient Instructions (Signed)
Continue to use the flonase daily for your symptoms. I also recommend using Mucinex to help with the increased mucous production.   I do recommend that you use the albuterol inhaler as needed and start the symbicort inhaler for daily maintenance. You won't have the same immediate response with the symbicort, but it works to help prevent inflammation in the lungs and protects you long term from damage and needing the use of the albuterol.   I do recommend that you get COVID testing just to be sure given the high rate of community transmission and the symptoms that often present just like you are experiencing.   I have sent in a medrol dose-pack, which is a taper of steroids over 6 days. Be sure to take the medication as prescribed so it will work best. I also sent azithromycin which is an antibiotic. You can start this over the weekend if you symptoms get worse or if you are not showing any improvement with the steroid alone. This will help cover any possible bacteria that may be present and making your symptoms worse.   Let us know if you are not feeling any better with the steroid and other medications.

## 2020-08-30 NOTE — Progress Notes (Signed)
Virtual Visit via Telephone Note  I connected with  Ronnie Bowman on 08/30/20 at  1:10 PM EST by telephone and verified that I am speaking with the correct person using two identifiers.   I discussed the limitations, risks, security and privacy concerns of performing an evaluation and management service by telephone and the availability of in person appointments. I also discussed with the patient that there may be a patient responsible charge related to this service. The patient expressed understanding and agreed to proceed.  Participating parties included in this telephone visit include: The patient and the nurse practitioner listed.  The patient is: At home I am: In the office  Subjective:    CC: Cough and COPD exacerbation  HPI: Ronnie Bowman is a 65 y.o. year old male presenting today via telephone visit to discuss cough, congestion, increased mucous production, and shortness of breath that started about 4 days ago. He reports that he has COPD and his symptoms are consistent with previous exacerbations. He tells me that the congestion clears up during the day, but the cough is consistent and his mucous has become thicker and harder to clear. He is concerned with the shortness of breath. He is vaccinated against COVID and denies body aches, fever, loss of taste, loss of smell, chest pain, sore throat, and fatigue.   Has been using flonase, which he feels helps with the congestion. He has not been using his Symbicort or Albuterol inhaler.   Past medical history, Surgical history, Family history not pertinant except as noted below, Social history, Allergies, and medications have been entered into the medical record, reviewed, and corrections made.   Review of Systems:  All review of systems negative except what is listed in the HPI  Objective:    General:  Patient speaking clearly in complete sentences. No shortness of breath noted.   Alert and oriented x3.   Normal judgment.  No  apparent acute distress.  Impression and Recommendations:    1. Cough  2. Chronic obstructive pulmonary disease, unspecified COPD type (HCC)  COPD exacerbation most likely with possible sinobronchitis present. Recommend increased fluid intake, continue Flonase daily. Mucinex may also be helpful for symptom management.  It is advised to have a COVID test just to be sure considering the high rate of community transmission present at this time.  Start albuterol inhaler for shortness of breath and recommend Symbicort twice a day for long term symptom control.  Will send prescription for medrol dose-pack and recommend starting immediately.  Azithromycin sent to start over the weekend if symptoms do not improve or worsen.  Follow-up if symptoms persist or get worse.    I discussed the assessment and treatment plan with the patient. The patient was provided an opportunity to ask questions and all were answered. The patient agreed with the plan and demonstrated an understanding of the instructions.   The patient was advised to call back or seek an in-person evaluation if the symptoms worsen or if the condition fails to improve as anticipated.  I provided 20 minutes of non-face-to-face time during this TELEPHONE encounter.    Tollie Eth, NP

## 2020-12-30 ENCOUNTER — Other Ambulatory Visit: Payer: Self-pay | Admitting: Osteopathic Medicine

## 2021-01-04 ENCOUNTER — Other Ambulatory Visit: Payer: Self-pay | Admitting: Osteopathic Medicine

## 2021-01-27 ENCOUNTER — Other Ambulatory Visit: Payer: Self-pay | Admitting: Osteopathic Medicine

## 2021-03-02 ENCOUNTER — Other Ambulatory Visit: Payer: Self-pay | Admitting: Osteopathic Medicine

## 2021-03-14 ENCOUNTER — Ambulatory Visit (INDEPENDENT_AMBULATORY_CARE_PROVIDER_SITE_OTHER): Payer: 59 | Admitting: Osteopathic Medicine

## 2021-03-14 ENCOUNTER — Other Ambulatory Visit: Payer: Self-pay | Admitting: Osteopathic Medicine

## 2021-03-14 ENCOUNTER — Encounter: Payer: Self-pay | Admitting: Osteopathic Medicine

## 2021-03-14 VITALS — BP 139/72 | HR 67 | Temp 98.1°F | Wt 203.0 lb

## 2021-03-14 DIAGNOSIS — I10 Essential (primary) hypertension: Secondary | ICD-10-CM | POA: Diagnosis not present

## 2021-03-14 DIAGNOSIS — R600 Localized edema: Secondary | ICD-10-CM

## 2021-03-14 DIAGNOSIS — Z0000 Encounter for general adult medical examination without abnormal findings: Secondary | ICD-10-CM | POA: Diagnosis not present

## 2021-03-14 DIAGNOSIS — J449 Chronic obstructive pulmonary disease, unspecified: Secondary | ICD-10-CM

## 2021-03-14 MED ORDER — LISINOPRIL 40 MG PO TABS: ORAL_TABLET | ORAL | 1 refills | Status: DC

## 2021-03-14 MED ORDER — BUDESONIDE-FORMOTEROL FUMARATE 160-4.5 MCG/ACT IN AERO: 2.0000 | INHALATION_SPRAY | Freq: Two times a day (BID) | RESPIRATORY_TRACT | 11 refills | Status: DC

## 2021-03-14 MED ORDER — SPIRONOLACTONE 50 MG PO TABS: ORAL_TABLET | ORAL | 1 refills | Status: DC

## 2021-03-14 MED ORDER — NITROGLYCERIN 0.4 MG SL SUBL: 0.4000 mg | SUBLINGUAL_TABLET | SUBLINGUAL | 3 refills | Status: AC | PRN

## 2021-03-14 MED ORDER — AMLODIPINE BESYLATE 10 MG PO TABS: 10.0000 mg | ORAL_TABLET | Freq: Every day | ORAL | 1 refills | Status: DC

## 2021-03-14 MED ORDER — ASPIRIN EC 81 MG PO TBEC: 81.0000 mg | DELAYED_RELEASE_TABLET | Freq: Every day | ORAL | 3 refills | Status: AC

## 2021-03-14 MED ORDER — ATORVASTATIN CALCIUM 20 MG PO TABS: 20.0000 mg | ORAL_TABLET | Freq: Every day | ORAL | 3 refills | Status: DC

## 2021-03-14 MED ORDER — CARVEDILOL 25 MG PO TABS: 25.0000 mg | ORAL_TABLET | Freq: Two times a day (BID) | ORAL | 1 refills | Status: DC

## 2021-03-14 MED ORDER — FUROSEMIDE 20 MG PO TABS: 20.0000 mg | ORAL_TABLET | Freq: Two times a day (BID) | ORAL | 1 refills | Status: DC

## 2021-03-14 MED ORDER — BENZONATATE 200 MG PO CAPS: 200.0000 mg | ORAL_CAPSULE | Freq: Three times a day (TID) | ORAL | 0 refills | Status: DC | PRN

## 2021-03-14 MED ORDER — ALBUTEROL SULFATE HFA 108 (90 BASE) MCG/ACT IN AERS: 2.0000 | INHALATION_SPRAY | Freq: Four times a day (QID) | RESPIRATORY_TRACT | 11 refills | Status: DC | PRN

## 2021-03-14 NOTE — Patient Instructions (Addendum)
Labs today Medications refilled Lasix (furosemide) water pill 1-2 per day Use Symbicort inhaler twice daily should help breathing  Recommend colon cancer screening

## 2021-03-14 NOTE — Telephone Encounter (Signed)
Prior authorization required for Symbicort. Thanks in advance.

## 2021-03-14 NOTE — Progress Notes (Signed)
Ronnie Bowman is a 65 y.o. male who presents to  [facility] at [facility]  today, 03/14/21, seeking care for the following:  Medication refills, check up on COPD, occasional lower extremity edema.  He is taking Lasix once daily most days.  He is not typically taking his Symbicort and is concerned about frequent coughing episodes with mucus, frequent shortness of breath if he is outside in the heat.   ASSESSMENT & PLAN with other pertinent findings:  The primary encounter diagnosis was Hypertension goal BP (blood pressure) < 130/80. Diagnoses of Mild peripheral edema, Chronic obstructive pulmonary disease, unspecified COPD type (HCC), and Encounter for screening and preventative care were also pertinent to this visit.   COPD not very well controlled, advise daily use of Symbicort  Echo reviewed, no concern for CHF but he does have some likely dependent edema problems, Lasix as needed, educated on appropriate use of compression stockings, hydration, leg elevation, walking.    Patient Instructions  Labs today Medications refilled Lasix (furosemide) water pill 1-2 per day Use Symbicort inhaler twice daily should help breathing  Recommend colon cancer screening    Orders Placed This Encounter  Procedures   CBC   COMPLETE METABOLIC PANEL WITH GFR   Lipid panel   TSH   PSA, Total with Reflex to PSA, Free   Hepatitis C antibody   HIV Antibody (routine testing w rflx)    Meds ordered this encounter  Medications   spironolactone (ALDACTONE) 50 MG tablet    Sig: Take 1 tablet by mouth once daily.    Dispense:  90 tablet    Refill:  1   nitroGLYCERIN (NITROSTAT) 0.4 MG SL tablet    Sig: Place 1 tablet (0.4 mg total) under the tongue every 5 (five) minutes as needed for chest pain.    Dispense:  30 tablet    Refill:  3   lisinopril (ZESTRIL) 40 MG tablet    Sig: Take 1 tablet by mouth once daily.    Dispense:  90 tablet    Refill:  1    furosemide (LASIX) 20 MG tablet    Sig: Take 1 tablet (20 mg total) by mouth 2 (two) times daily. Increase to 2 tablets (40 mg) bid for no more than 3 days prn weight gain 5 lbs over 48 hours OR increased lower extremity swelling    Dispense:  180 tablet    Refill:  1   carvedilol (COREG) 25 MG tablet    Sig: Take 1 tablet (25 mg total) by mouth 2 (two) times daily with a meal.    Dispense:  180 tablet    Refill:  1   budesonide-formoterol (SYMBICORT) 160-4.5 MCG/ACT inhaler    Sig: Inhale 2 puffs into the lungs 2 (two) times daily.    Dispense:  30.6 g    Refill:  11   benzonatate (TESSALON) 200 MG capsule    Sig: Take 1 capsule (200 mg total) by mouth 3 (three) times daily as needed for cough.    Dispense:  20 capsule    Refill:  0   atorvastatin (LIPITOR) 20 MG tablet    Sig: Take 1 tablet (20 mg total) by mouth at bedtime.    Dispense:  90 tablet    Refill:  3   aspirin EC 81 MG tablet    Sig: Take 1 tablet (81 mg total) by mouth daily.    Dispense:  90 tablet    Refill:  3   amLODipine (NORVASC) 10 MG tablet    Sig: Take 1 tablet (10 mg total) by mouth daily.    Dispense:  90 tablet    Refill:  1   albuterol (VENTOLIN HFA) 108 (90 Base) MCG/ACT inhaler    Sig: Inhale 2 puffs into the lungs every 6 (six) hours as needed for wheezing.    Dispense:  2 each    Refill:  11     See below for relevant physical exam findings  See below for recent lab and imaging results reviewed  Medications, allergies, PMH, PSH, SocH, FamH reviewed below    Follow-up instructions: Return in about 6 months (around 09/14/2021) for LABS AND CHECK UP ROUTINE CONDITIONS - WE WILL CALL YOU IN 5 MONTHS! .                                        Exam:  BP 139/72 (BP Location: Left Arm, Patient Position: Sitting, Cuff Size: Normal)   Pulse 67   Temp 98.1 F (36.7 C) (Oral)   Wt 203 lb 0.6 oz (92.1 kg)   BMI 29.98 kg/m  Constitutional: VS see above. General  Appearance: alert, well-developed, well-nourished, NAD Neck: No masses, trachea midline.  Respiratory: Normal respiratory effort. no wheeze, no rhonchi, no rales, diminished breath sounds in all lung fields, Cardiovascular: S1/S2 normal, no murmur, no rub/gallop auscultated. RRR.  Trace lower extremity edema Musculoskeletal: Gait normal. Symmetric and independent movement of all extremities Neurological: Normal balance/coordination. No tremor. Skin: warm, dry, intact.  Significant sun damage but no obvious concern for malignancy Psychiatric: Normal judgment/insight. Normal mood and affect. Oriented x3.   Current Meds  Medication Sig   AMBULATORY NON FORMULARY MEDICATION Take 2 each by mouth every morning. Medication Name: Goli gummies   Melatonin 10 MG CAPS Take 10 mg by mouth.   [DISCONTINUED] albuterol (VENTOLIN HFA) 108 (90 Base) MCG/ACT inhaler Inhale 2 puffs into the lungs every 6 (six) hours as needed for wheezing.   [DISCONTINUED] amLODipine (NORVASC) 10 MG tablet Take 1 tablet (10 mg total) by mouth daily.   [DISCONTINUED] aspirin EC 81 MG tablet Take 1 tablet (81 mg total) by mouth daily.   [DISCONTINUED] atorvastatin (LIPITOR) 20 MG tablet Take 1 tablet (20 mg total) by mouth at bedtime.   [DISCONTINUED] benzonatate (TESSALON) 200 MG capsule Take 1 capsule (200 mg total) by mouth 3 (three) times daily as needed for cough.   [DISCONTINUED] budesonide-formoterol (SYMBICORT) 160-4.5 MCG/ACT inhaler Inhale 2 puffs into the lungs 2 (two) times daily.   [DISCONTINUED] carvedilol (COREG) 25 MG tablet TAKE 1 TABLET BY MOUTH TWICE DAILY WITH MEALS   [DISCONTINUED] furosemide (LASIX) 20 MG tablet Take 1 tablet (20 mg total) by mouth 2 (two) times daily as needed. Weight gain 5 or more pounds lbs in 3 days or lower extremity swelling   [DISCONTINUED] lisinopril (ZESTRIL) 40 MG tablet Take 1 tablet by mouth once daily. NO REFILLS. OVERDUE FOR AN APPT.   [DISCONTINUED] methylPREDNISolone (MEDROL  DOSEPAK) 4 MG TBPK tablet 6-day pack as directed   [DISCONTINUED] nitroGLYCERIN (NITROSTAT) 0.4 MG SL tablet Place 1 tablet (0.4 mg total) under the tongue every 5 (five) minutes as needed for chest pain.   [DISCONTINUED] spironolactone (ALDACTONE) 50 MG tablet Take 1 tablet by mouth once daily. NO REFILLS. OVERDUE FOR AN APPT.    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   Hyperalbuminemia 12/27/2018   Long term current use of anticoagulants with INR goal of 2.0-3.0 04/15/2018   Hypertension goal BP (blood pressure) < 130/80 04/15/2018   History of pulmonary embolism 04/15/2018   Chronic obstructive pulmonary disease (HCC) 04/15/2018   Primary insomnia 04/15/2018    Family History  Problem Relation Age of Onset   Leukemia Mother    Heart failure Father     Social History   Tobacco Use  Smoking Status Former   Packs/day: 2.00   Years: 45.00   Pack years: 90.00   Types: Cigarettes   Quit date: 11/01/2017   Years since quitting: 3.3  Smokeless Tobacco Never    No past surgical history on file.  Immunization History  Administered Date(s) Administered   Pneumococcal Polysaccharide-23 03/17/2018   Tdap 12/24/2009    No results found for this or any previous visit (from the past 2160 hour(s)).  No results found.     All questions at time of visit were answered - patient instructed to contact office with any additional concerns or updates. ER/RTC precautions were reviewed with the patient as applicable.   Please note: manual typing as well as voice recognition software may have been used to produce this document - typos may escape review. Please contact Dr. Lyn Hollingshead for any needed  clarifications.

## 2021-03-14 NOTE — Telephone Encounter (Signed)
Medication: budesonide-formoterol  160-4.5 mcg/act Prior authorization submitted via CoverMyMeds [place] submission pending

## 2021-03-17 ENCOUNTER — Telehealth: Payer: Self-pay

## 2021-03-17 NOTE — Telephone Encounter (Signed)
Received notification of PA for Symbicort 160-4.5 mcg/act needed. Notice received from [employer] stating the medication is on the plan's list of covered drugs.

## 2021-03-19 ENCOUNTER — Other Ambulatory Visit: Payer: Self-pay

## 2021-03-19 DIAGNOSIS — J449 Chronic obstructive pulmonary disease, unspecified: Secondary | ICD-10-CM

## 2021-03-19 MED ORDER — BUDESONIDE-FORMOTEROL FUMARATE 160-4.5 MCG/ACT IN AERO
2.0000 | INHALATION_SPRAY | Freq: Two times a day (BID) | RESPIRATORY_TRACT | 11 refills | Status: DC
Start: 2021-03-19 — End: 2021-09-05

## 2021-03-19 NOTE — Progress Notes (Signed)
Initial refill of Symbicort failed.

## 2021-03-20 LAB — LIPID PANEL
Cholesterol: 163 mg/dL (ref <200)
HDL: 62 mg/dL (ref >=40)
LDL Cholesterol (Calc): 79 mg/dL (calc)
Non-HDL Cholesterol (Calc): 101 mg/dL (calc) (ref <130)
Total CHOL/HDL Ratio: 2.6 (calc) (ref <5.0)
Triglycerides: 122 mg/dL (ref <150)

## 2021-03-20 LAB — COMPLETE METABOLIC PANEL WITH GFR
AG Ratio: 0.9 (calc) — ABNORMAL LOW (ref 1.0–2.5)
ALT: 11 U/L (ref 9–46)
AST: 12 U/L (ref 10–35)
Albumin: 3.6 g/dL (ref 3.6–5.1)
Alkaline phosphatase (APISO): 53 U/L (ref 35–144)
BUN: 15 mg/dL (ref 7–25)
CO2: 36 mmol/L — ABNORMAL HIGH (ref 20–32)
Calcium: 8.7 mg/dL (ref 8.6–10.3)
Chloride: 97 mmol/L — ABNORMAL LOW (ref 98–110)
Creat: 1.09 mg/dL (ref 0.70–1.35)
Globulin: 4.2 g/dL (calc) — ABNORMAL HIGH (ref 1.9–3.7)
Glucose, Bld: 128 mg/dL — ABNORMAL HIGH (ref 65–99)
Potassium: 3.4 mmol/L — ABNORMAL LOW (ref 3.5–5.3)
Sodium: 138 mmol/L (ref 135–146)
Total Bilirubin: 1.3 mg/dL — ABNORMAL HIGH (ref 0.2–1.2)
Total Protein: 7.8 g/dL (ref 6.1–8.1)
eGFR: 77 mL/min/{1.73_m2} (ref >=60)

## 2021-03-20 LAB — HEPATITIS C ANTIBODY
Hepatitis C Ab: NONREACTIVE
SIGNAL TO CUT-OFF: 0.01 (ref <1.00)

## 2021-03-20 LAB — PSA, TOTAL WITH REFLEX TO PSA, FREE: PSA, Total: 0.4 ng/mL (ref <=4.0)

## 2021-03-20 LAB — CBC
HCT: 46.2 % (ref 38.5–50.0)
Hemoglobin: 16.2 g/dL (ref 13.2–17.1)
MCH: 36.2 pg — ABNORMAL HIGH (ref 27.0–33.0)
MCHC: 35.1 g/dL (ref 32.0–36.0)
MCV: 103.4 fL — ABNORMAL HIGH (ref 80.0–100.0)
MPV: 11.4 fL (ref 7.5–12.5)
Platelets: 244 10*3/uL (ref 140–400)
RBC: 4.47 10*6/uL (ref 4.20–5.80)
RDW: 14.2 % (ref 11.0–15.0)
WBC: 6.8 10*3/uL (ref 3.8–10.8)

## 2021-03-20 LAB — HIV ANTIBODY (ROUTINE TESTING W REFLEX): HIV 1&2 Ab, 4th Generation: NONREACTIVE

## 2021-03-20 LAB — TSH: TSH: 1.27 mIU/L (ref 0.40–4.50)

## 2021-03-20 LAB — B12 AND FOLATE PANEL

## 2021-04-10 ENCOUNTER — Telehealth: Payer: Self-pay | Admitting: Osteopathic Medicine

## 2021-04-10 DIAGNOSIS — D7589 Other specified diseases of blood and blood-forming organs: Secondary | ICD-10-CM

## 2021-04-10 DIAGNOSIS — R771 Abnormality of globulin: Secondary | ICD-10-CM

## 2021-04-10 NOTE — Telephone Encounter (Signed)
-----   Message from Sunnie Nielsen, DO sent at 03/20/2021  5:18 PM EDT ----- Recheck CBC, CMP.  Would get reticulocyte count, peripheral smear for macrocytosis.

## 2021-04-10 NOTE — Telephone Encounter (Signed)
Please call pt - orders in to recheck blood work, needs appt w/ phlebotomy here or he can [facility] at [facility] - orders are IN

## 2021-04-11 ENCOUNTER — Emergency Department (INDEPENDENT_AMBULATORY_CARE_PROVIDER_SITE_OTHER): Payer: 59

## 2021-04-11 ENCOUNTER — Emergency Department (INDEPENDENT_AMBULATORY_CARE_PROVIDER_SITE_OTHER)
Admission: EM | Admit: 2021-04-11 | Discharge: 2021-04-11 | Disposition: A | Discharge status: Home / Self Care | Payer: 59 | Source: Home / Self Care

## 2021-04-11 ENCOUNTER — Other Ambulatory Visit: Payer: Self-pay

## 2021-04-11 ENCOUNTER — Telehealth: Payer: Self-pay | Admitting: Osteopathic Medicine

## 2021-04-11 ENCOUNTER — Encounter: Payer: Self-pay | Admitting: Emergency Medicine

## 2021-04-11 DIAGNOSIS — L03116 Cellulitis of left lower limb: Secondary | ICD-10-CM

## 2021-04-11 DIAGNOSIS — R600 Localized edema: Secondary | ICD-10-CM

## 2021-04-11 DIAGNOSIS — I5020 Unspecified systolic (congestive) heart failure: Secondary | ICD-10-CM

## 2021-04-11 DIAGNOSIS — L03115 Cellulitis of right lower limb: Secondary | ICD-10-CM

## 2021-04-11 DIAGNOSIS — I10 Essential (primary) hypertension: Secondary | ICD-10-CM

## 2021-04-11 LAB — POCT URINALYSIS DIP (MANUAL ENTRY)
Bilirubin, UA: NEGATIVE
Blood, UA: NEGATIVE
Glucose, UA: NEGATIVE mg/dL
Ketones, POC UA: NEGATIVE mg/dL
Leukocytes, UA: NEGATIVE
Nitrite, UA: NEGATIVE
Protein Ur, POC: NEGATIVE mg/dL
Spec Grav, UA: 1.02 (ref 1.010–1.025)
Urobilinogen, UA: 0.2 E.U./dL
pH, UA: 5.5 (ref 5.0–8.0)

## 2021-04-11 MED ORDER — DOXYCYCLINE HYCLATE 100 MG PO CAPS
100.0000 mg | ORAL_CAPSULE | Freq: Two times a day (BID) | ORAL | 0 refills | Status: DC
Start: 2021-04-11 — End: 2021-04-18

## 2021-04-11 NOTE — Discharge Instructions (Addendum)
Advised patient to take medication as directed with food to completion.  Advised patient we will follow-up with lab results once returned.

## 2021-04-11 NOTE — Telephone Encounter (Signed)
Orders Placed This Encounter  Procedures   Ambulatory referral to Cardiology    Referral Priority:   Routine    Referral Type:   Consultation    Referral Reason:   Specialty Services Required    Requested Specialty:   Cardiology    Number of Visits Requested:   1    

## 2021-04-11 NOTE — Telephone Encounter (Signed)
Let patient know labs are in.

## 2021-04-11 NOTE — Telephone Encounter (Signed)
This is the patient we talked about for a cardiology referral. I let him know it's getting worked on and scheduled him with Dr. Lyn Hollingshead later next week.

## 2021-04-11 NOTE — ED Provider Notes (Signed)
[facility]    CSN: [id number] Arrival date & time: 04/11/21  0813      History   Chief Complaint Chief Complaint  Patient presents with   Cellulitis    HPI Nas Wafer is a 65 y.o. male.   HPI 65 year old male presents with bilateral edema and lower leg redness for days.  PMH significant for CHF, acute PE, and COPD.  Patient has just established with new PCP recently and reports taking 1 dose of Lasix today.  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 No pertinent surgical history.     Home Medications    Prior to Admission medications   Medication Sig Start Date End Date Taking? Authorizing Provider  doxycycline (VIBRAMYCIN) 100 MG capsule Take 1 capsule (100 mg total) by mouth 2 (two) times daily. 04/11/21  Yes Trevor Iha, FNP  albuterol (VENTOLIN HFA) 108 (90 Base) MCG/ACT inhaler Inhale 2 puffs into the lungs every 6 (six) hours as needed for wheezing. 03/14/21   Sunnie Nielsen, DO  AMBULATORY NON FORMULARY MEDICATION Take 2 each by mouth every morning. Medication Name: Goli gummies    [provider]  amLODipine  (NORVASC) 10 MG tablet Take 1 tablet (10 mg total) by mouth daily. 03/14/21   Sunnie Nielsen, DO  aspirin EC 81 MG tablet Take 1 tablet (81 mg total) by mouth daily. Patient not taking: Reported on 04/11/2021 03/14/21   Sunnie Nielsen, DO  atorvastatin (LIPITOR) 20 MG tablet Take 1 tablet (20 mg total) by mouth at bedtime. 03/14/21   Sunnie Nielsen, DO  benzonatate (TESSALON) 200 MG capsule Take 1 capsule (200 mg total) by mouth 3 (three) times daily as needed for cough. Patient not taking: Reported on 04/11/2021 03/14/21   Sunnie Nielsen, DO  budesonide-formoterol [facility]) 160-4.5 MCG/ACT inhaler Inhale 2 puffs into the lungs 2 (two) times daily. 03/19/21   Sunnie Nielsen, DO  carvedilol (COREG) 25 MG tablet Take 1 tablet (25 mg total) by mouth 2 (two) times daily with a meal. 03/14/21   Sunnie Nielsen, DO  furosemide (LASIX) 20 MG tablet Take 1 tablet (20 mg total) by mouth 2 (two) times daily. Increase to 2 tablets (40 mg) bid for no more than 3 days prn weight gain 5 lbs over 48 hours OR increased lower extremity swelling 03/14/21   Sunnie Nielsen, DO  lisinopril (ZESTRIL) 40 MG tablet Take 1 tablet by mouth once daily. 03/14/21   Sunnie Nielsen, DO  Melatonin 10 MG CAPS Take 10 mg by mouth. Patient not taking: Reported on 04/11/2021    [provider]  nitroGLYCERIN (NITROSTAT) 0.4 MG SL tablet Place 1 tablet (0.4 mg total) under the tongue every 5 (  five) minutes as needed for chest pain. 03/14/21   Sunnie Nielsen, DO  spironolactone (ALDACTONE) 50 MG tablet Take 1 tablet by mouth once daily. 03/14/21   Sunnie Nielsen, DO    Family History Family History  Problem Relation Age of Onset   Leukemia Mother    Heart failure Father     Social History Social History   Tobacco Use   Smoking status: Former    Packs/day: 2.00    Years: 45.00    Pack years: 90.00    Types: Cigarettes    Quit date: 11/01/2017    Years since quitting: 3.4   Smokeless  tobacco: Never  Vaping Use   Vaping Use: Former  Substance Use Topics   Alcohol use: Yes    Alcohol/week: 3.0 standard drinks    Types: 3 Standard drinks or equivalent per week    Comment: 5th a week   Drug use: Never     Allergies   Patient has no known allergies.   Review of Systems Review of Systems  Skin:  Positive for rash.    Physical Exam Triage Vital Signs ED Triage Vitals  Enc Vitals Group     BP      Pulse      Resp      Temp      Temp src      SpO2      Weight      Height      Head Circumference      Peak Flow      Pain Score      Pain Loc      Pain Edu?      Excl. in GC?    No data found.  Updated Vital Signs BP 118/70 (BP Location: Left Arm)   Pulse 81   Temp 98.1 F (36.7 C) (Oral)   Resp (!) 22   Ht 5\' 9"  (1.753 m)   Wt 210 lb (95.3 kg)   SpO2 93%   BMI 31.01 kg/m    Physical Exam Vitals and nursing note reviewed.  Constitutional:      General: He is not in acute distress.    Appearance: Normal appearance. He is normal weight. He is not ill-appearing.  HENT:     Head: Normocephalic and atraumatic.     Nose: Nose normal.     Mouth/Throat:     Mouth: Mucous membranes are moist.     Pharynx: Oropharynx is clear.  Eyes:     Extraocular Movements: Extraocular movements intact.     Conjunctiva/sclera: Conjunctivae normal.     Pupils: Pupils are equal, round, and reactive to light.  Cardiovascular:     Rate and Rhythm: Normal rate and regular rhythm.     Heart sounds: Normal heart sounds. No murmur heard.   No friction rub. No gallop.     Comments: +1 pitting/nonpitting edema noted bilaterally, PT pulses present Pulmonary:     Effort: Pulmonary effort is normal. No respiratory distress.     Breath sounds: Normal breath sounds. No stridor. No wheezing, rhonchi or rales.  Chest:     Chest wall: No tenderness.  Musculoskeletal:        General: Normal range of motion.     Cervical back: Normal range of motion and neck supple.   Skin:    General: Skin is warm and dry.     Comments: Bilateral lower legs: Erythematous maculopapular eruption, indurated, fluctuant, mild lichenification noted PT pulses are present-laterally  Neurological:     General: No focal deficit present.     Mental Status: He is alert and oriented to person, place, and time. Mental status is at baseline.  Psychiatric:        Mood and Affect: Mood normal.        Behavior: Behavior normal.        Thought Content: Thought content normal.     UC Treatments / Results  Labs (all labs ordered are listed, but only abnormal results are displayed) Labs Reviewed  COMPLETE METABOLIC PANEL WITH GFR  CBC WITH DIFFERENTIAL/PLATELET  TSH  BRAIN NATRIURETIC PEPTIDE  HEMOGLOBIN A1C  LIPID PANEL  VITAMIN D 25 HYDROXY (VIT D DEFICIENCY, FRACTURES)  POCT URINALYSIS DIP (MANUAL ENTRY)    EKG   Radiology DG Chest 2 View  Result Date: 04/11/2021 CLINICAL DATA:  Lower extremity edema. EXAM: CHEST - 2 VIEW COMPARISON:  07/09/2020 FINDINGS: Lungs are hyperexpanded. The lungs are clear without focal pneumonia, edema, pneumothorax or pleural effusion. Interstitial markings are diffusely coarsened with chronic features. The cardiopericardial silhouette is within normal limits for size. The visualized bony structures of the thorax show no acute abnormality. IMPRESSION: No active cardiopulmonary disease. Electronically Signed   By: Kennith Center M.D.   On: 04/11/2021 09:19    Procedures Procedures (including critical care time)  Medications Ordered in UC Medications - No data to display  Initial Impression / Assessment and Plan / UC Course  I have reviewed the triage vital signs and the nursing notes.  Pertinent labs & imaging results that were available during my care of the patient were reviewed by me and considered in my medical decision making (see chart for details).     MDM: 1.  Bilateral lower leg cellulitis-Rx Doxycycline, CXR was negative for  acute cardiopulmonary process; 2.  Bilateral lower extremity edema-PT pulses present bilaterally, CXR was negative; 3.  Systolic congestive heart failure, unspecified heart failure, chronicity-serological testing performed advised patient we will follow-up once lab results are resulted.  Patient is scheduled to see PCP on 04/14/2021.  Strongly encouraged patient to seek cardiology and pulmonary consult/referral from his PCP. Final Clinical Impressions(s) / UC Diagnoses   Final diagnoses:  Bilateral lower extremity edema  Systolic congestive heart failure, unspecified HF chronicity (HCC)  Bilateral lower leg cellulitis     Discharge Instructions      Advised patient to take medication as directed with food to completion.  Advised patient we will follow-up with lab results once returned.     ED Prescriptions     Medication Sig Dispense Auth. Provider   doxycycline (VIBRAMYCIN) 100 MG capsule Take 1 capsule (100 mg total) by mouth 2 (two) times daily. 20 capsule Trevor Iha, FNP      PDMP not reviewed this encounter.   Trevor Iha, FNP 04/11/21 [phone]

## 2021-04-11 NOTE — ED Triage Notes (Addendum)
Pt presents with bilateral cellulitis/edema to lower extremities PCP is Dr Lyn Hollingshead Pt has DOE  Took one dose of lasix today  Diminished breath sounds throughout  Pt was told last night by on call PCP to go to ED  Pt does not have a scale at home to weigh

## 2021-04-12 LAB — COMPLETE METABOLIC PANEL WITH GFR
AG Ratio: 0.8 (calc) — ABNORMAL LOW (ref 1.0–2.5)
ALT: 10 U/L (ref 9–46)
AST: 13 U/L (ref 10–35)
Albumin: 3.4 g/dL — ABNORMAL LOW (ref 3.6–5.1)
Alkaline phosphatase (APISO): 65 U/L (ref 35–144)
BUN: 10 mg/dL (ref 7–25)
CO2: 32 mmol/L (ref 20–32)
Calcium: 8.9 mg/dL (ref 8.6–10.3)
Chloride: 95 mmol/L — ABNORMAL LOW (ref 98–110)
Creat: 0.97 mg/dL (ref 0.70–1.35)
Globulin: 4.1 g/dL (calc) — ABNORMAL HIGH (ref 1.9–3.7)
Glucose, Bld: 103 mg/dL — ABNORMAL HIGH (ref 65–99)
Potassium: 3.5 mmol/L (ref 3.5–5.3)
Sodium: 137 mmol/L (ref 135–146)
Total Bilirubin: 1.2 mg/dL (ref 0.2–1.2)
Total Protein: 7.5 g/dL (ref 6.1–8.1)
eGFR: 88 mL/min/{1.73_m2} (ref >=60)

## 2021-04-12 LAB — CBC WITH DIFFERENTIAL/PLATELET
Absolute Monocytes: 810 cells/uL (ref 200–950)
Basophils Absolute: 99 cells/uL (ref 0–200)
Basophils Relative: 1.1 %
Eosinophils Absolute: 171 cells/uL (ref 15–500)
Eosinophils Relative: 1.9 %
HCT: 48.9 % (ref 38.5–50.0)
Hemoglobin: 17.8 g/dL — ABNORMAL HIGH (ref 13.2–17.1)
Lymphs Abs: 1629 cells/uL (ref 850–3900)
MCH: 36.5 pg — ABNORMAL HIGH (ref 27.0–33.0)
MCHC: 36.4 g/dL — ABNORMAL HIGH (ref 32.0–36.0)
MCV: 100.2 fL — ABNORMAL HIGH (ref 80.0–100.0)
MPV: 11.2 fL (ref 7.5–12.5)
Monocytes Relative: 9 %
Neutro Abs: 6291 cells/uL (ref 1500–7800)
Neutrophils Relative %: 69.9 %
Platelets: 290 10*3/uL (ref 140–400)
RBC: 4.88 10*6/uL (ref 4.20–5.80)
RDW: 13.2 % (ref 11.0–15.0)
Total Lymphocyte: 18.1 %
WBC: 9 10*3/uL (ref 3.8–10.8)

## 2021-04-12 LAB — LIPID PANEL
Cholesterol: 144 mg/dL (ref <200)
HDL: 55 mg/dL (ref >=40)
LDL Cholesterol (Calc): 67 mg/dL (calc)
Non-HDL Cholesterol (Calc): 89 mg/dL (calc) (ref <130)
Total CHOL/HDL Ratio: 2.6 (calc) (ref <5.0)
Triglycerides: 137 mg/dL (ref <150)

## 2021-04-12 LAB — HEMOGLOBIN A1C
Hgb A1c MFr Bld: 5.2 % of total Hgb (ref <5.7)
Mean Plasma Glucose: 103 mg/dL
eAG (mmol/L): 5.7 mmol/L

## 2021-04-12 LAB — BRAIN NATRIURETIC PEPTIDE: Brain Natriuretic Peptide: 86 pg/mL (ref <100)

## 2021-04-12 LAB — TSH: TSH: 1.44 mIU/L (ref 0.40–4.50)

## 2021-04-12 LAB — VITAMIN D 25 HYDROXY (VIT D DEFICIENCY, FRACTURES): Vit D, 25-Hydroxy: 31 ng/mL (ref 30–100)

## 2021-04-15 ENCOUNTER — Ambulatory Visit: Payer: 59 | Admitting: Osteopathic Medicine

## 2021-04-18 ENCOUNTER — Encounter: Payer: Self-pay | Admitting: Osteopathic Medicine

## 2021-04-18 ENCOUNTER — Ambulatory Visit (INDEPENDENT_AMBULATORY_CARE_PROVIDER_SITE_OTHER): Payer: 59 | Admitting: Osteopathic Medicine

## 2021-04-18 VITALS — BP 126/66 | HR 60 | Temp 97.8°F | Wt 212.1 lb

## 2021-04-18 DIAGNOSIS — R0609 Other forms of dyspnea: Secondary | ICD-10-CM

## 2021-04-18 DIAGNOSIS — R0600 Dyspnea, unspecified: Secondary | ICD-10-CM

## 2021-04-18 DIAGNOSIS — Z23 Encounter for immunization: Secondary | ICD-10-CM | POA: Diagnosis not present

## 2021-04-18 DIAGNOSIS — J449 Chronic obstructive pulmonary disease, unspecified: Secondary | ICD-10-CM | POA: Diagnosis not present

## 2021-04-18 MED ORDER — TRIAMCINOLONE ACETONIDE 0.1 % EX CREA: TOPICAL_CREAM | CUTANEOUS | 1 refills | Status: DC

## 2021-04-18 MED ORDER — AMITRIPTYLINE HCL 75 MG PO TABS: 75.0000 mg | ORAL_TABLET | Freq: Every evening | ORAL | 0 refills | Status: DC | PRN

## 2021-04-18 NOTE — Progress Notes (Signed)
Ronnie Bowman is a 65 y.o. male who presents to  [place] at [facility]  today, 04/18/21, seeking care for the following:  Concern for breathign, eneding cardiology referral, concern for leg infection. On UC visit, appropriate w/u for cardiac issues, I think his SOBmore d/t COPD and nonadherence to inhaler therapy. He's nervous about lower extremities, see photo, appears to be PVD related.  Concern for insomnia, longstanding, requests Rx for this         ASSESSMENT & PLAN with other pertinent findings:  The primary encounter diagnosis was Chronic obstructive pulmonary disease, unspecified COPD type ([place]). Diagnoses of Dyspnea on exertion and Need for influenza vaccination were also pertinent to this visit.   Referral to cardiology, dyspnea on exertion, possibly needs stress test but I don't suspect CHF.   Pt decliens inhalers and O2 eval, I think he could definitely benefit from portable O2 just based on his SaO2 at [facility].    Patient Instructions  Referral has been ordered for cardiology! Please let us know if you haven't heard back within a week or so about scheduling an appointment. Someone should reach out to you. If you don't hear anything, please call our office and ask to speak to our referral coordinator!  I sent cream for legs, The skin redness is due to bad circulation from years of smoking. Work on increased waling!   YOU NEED TO TAKE YOUR INHALERS AND OTHER MEDICINES especially aspirin, atorvastatin.   I RECOMMEND OXYGEN.   Try Elavil for sleep.Given your breathing would NOT do heavy sedatives.   Orders Placed This Encounter  Procedures   Flu Vaccine QUAD 6+ mos PF IM (Fluarix Quad PF)   Ambulatory referral to Cardiology    Meds ordered this encounter  Medications   DISCONTD: triamcinolone cream (KENALOG) 0.1 %    Sig: Apply 1 application topically 2 (two) times daily for 7 days, THEN 1 application once a week. Can  increase back to twice per day if skin more red.    Dispense:  453.6 g    Refill:  1   amitriptyline (ELAVIL) 75 MG tablet    Sig: Take 1-2 tablets (75-150 mg total) by mouth at bedtime as needed for sleep.    Dispense:  90 tablet    Refill:  0   fluocinolone (VANOS) 0.01 % cream    Sig: Apply topically 3 (three) times daily. To affected area(s) as needed three times per day for redness/irritation lower legs, can use once weekly as maintenance/prevention    Dispense:  120 g    Refill:  1     See below for relevant physical exam findings  See below for recent lab and imaging results reviewed  Medications, allergies, PMH, PSH, SocH, FamH reviewed below    Follow-up instructions: Return in about 6 weeks (around 05/30/2021) for ESTABLISH W/ DR MATTHEWS, SEE [place] SOONER IF NEEDED! .                                        Exam:  BP 126/66 (BP Location: Left Arm, Patient Position: Sitting, Cuff Size: Normal)   Pulse 60   Temp 97.8 F (36.6 C) (Oral)   Wt 212 lb 1.9 oz (96.2 kg)   SpO2 (!) 85%   BMI 31.32 kg/m  Constitutional: VS see above. General Appearance: alert, well-developed, well-nourished, NAD Neck: No masses,  trachea midline.  Respiratory: Normal respiratory effort. no wheeze, no rhonchi, no rales Cardiovascular: S1/S2 normal, no murmur, no rub/gallop auscultated. RRR.  Musculoskeletal: Gait normal. Symmetric and independent movement of all extremities Neurological: Normal balance/coordination. No tremor. Skin: warm, dry, intact. See photo  Psychiatric: Normal judgment/insight. Normal mood and affect. Oriented x3.   Current Meds  Medication Sig   albuterol (VENTOLIN HFA) 108 (90 Base) MCG/ACT inhaler Inhale 2 puffs into the lungs every 6 (six) hours as needed for wheezing.   AMBULATORY NON FORMULARY MEDICATION Take 2 each by mouth every morning. Medication Name: [place] gummies   amitriptyline (ELAVIL) 75 MG tablet Take 1-2 tablets  (75-150 mg total) by mouth at bedtime as needed for sleep.   amLODipine (NORVASC) 10 MG tablet Take 1 tablet (10 mg total) by mouth daily.   carvedilol (COREG) 25 MG tablet Take 1 tablet (25 mg total) by mouth 2 (two) times daily with a meal.   fluocinolone (VANOS) 0.01 % cream Apply topically 3 (three) times daily. To affected area(s) as needed three times per day for redness/irritation lower legs, can use once weekly as maintenance/prevention   furosemide (LASIX) 20 MG tablet Take 1 tablet (20 mg total) by mouth 2 (two) times daily. Increase to 2 tablets (40 mg) bid for no more than 3 days prn weight gain 5 lbs over 48 hours OR increased lower extremity swelling   lisinopril (ZESTRIL) 40 MG tablet Take 1 tablet by mouth once daily.   spironolactone (ALDACTONE) 50 MG tablet Take 1 tablet by mouth once daily.   [DISCONTINUED] triamcinolone cream (KENALOG) 0.1 % Apply 1 application topically 2 (two) times daily for 7 days, THEN 1 application once a week. Can increase back to twice per day if skin more red.    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   Hyperalbuminemia 12/27/2018   Long term current use of anticoagulants with INR goal of 2.0-3.0 04/15/2018   Hypertension goal BP (blood pressure) < 130/80 04/15/2018   History of pulmonary embolism 04/15/2018   Chronic obstructive pulmonary disease ([place]) 04/15/2018   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  Immunization History  Administered Date(s) Administered   Influenza,inj,Quad PF,6+ Mos 04/18/2021   Pneumococcal Polysaccharide-23 03/17/2018   Tdap 12/24/2009    Recent Results (from the past 2160 hour(s))  CBC     Status: Abnormal   Collection Time: 03/14/21 12:00 AM  Result Value Ref Range   WBC 6.8 3.8 - 10.8 Thousand/uL   RBC 4.47 4.20 - 5.80 Million/uL   Hemoglobin 16.2 13.2 - 17.1 g/dL   HCT 46.2 38.5 - 50.0 %   MCV 103.4 (H) 80.0 - 100.0 fL   MCH 36.2 (H) 27.0 - 33.0 pg   MCHC 35.1 32.0 - 36.0 g/dL   RDW 14.2 11.0 - 15.0 %  Platelets 244 140 - 400 Thousand/uL   MPV 11.4 7.5 - 12.5 fL  COMPLETE METABOLIC PANEL WITH GFR     Status: Abnormal   Collection Time: 03/14/21 12:00 AM  Result Value Ref Range   Glucose, Bld 128 (H) 65 - 99 mg/dL    Comment: .            Fasting reference interval . For someone without known diabetes, a glucose value >125 mg/dL indicates that they may have diabetes and this should be confirmed with a follow-up test. .    BUN 15 7 - 25 mg/dL   Creat 1.09 0.70 - 1.35 mg/dL   eGFR 77 > OR = 60 mL/min/1.3m    Comment: The eGFR is based on the CKD-EPI 2021 equation. To calculate  the new eGFR from a previous Creatinine or Cystatin C result, go to https://www.kidney.org/professionals/ kdoqi/gfr%5Fcalculator    BUN/Creatinine Ratio NOT APPLICABLE 6 - 22 (calc)   Sodium 138 135 - 146 mmol/L   Potassium 3.4 (L) 3.5 - 5.3 mmol/L   Chloride 97 (L) 98 - 110 mmol/L   CO2 36 (H) 20 - 32 mmol/L   Calcium 8.7 8.6 - 10.3 mg/dL   Total Protein 7.8 6.1 - 8.1 g/dL   Albumin 3.6 3.6 - 5.1 g/dL   Globulin 4.2 (H) 1.9 - 3.7 g/dL (calc)   AG Ratio 0.9 (L) 1.0 - 2.5 (calc)   Total Bilirubin 1.3 (H) 0.2 - 1.2 mg/dL   Alkaline phosphatase (APISO) 53 35 - 144 U/L   AST 12 10 - 35 U/L   ALT 11 9 - 46 U/L  Lipid panel     Status: None   Collection Time: 03/14/21 12:00  AM  Result Value Ref Range   Cholesterol 163 <200 mg/dL   HDL 62 > OR = 40 mg/dL   Triglycerides 122 <150 mg/dL   LDL Cholesterol (Calc) 79 mg/dL (calc)    Comment: Reference range: <100 . Desirable range <100 mg/dL for primary prevention;   <70 mg/dL for patients with CHD or diabetic patients  with > or = 2 CHD risk factors. .Marland KitchenLDL-C is now calculated using the Martin-Hopkins  calculation, which is a validated novel method providing  better accuracy than the Friedewald equation in the  estimation of LDL-C.  MCresenciano Genreet al. JAnnamaria Helling 25009;381(82: 2061-2068  (http://education.Quest[facility].com/faq/FAQ164)    Total CHOL/HDL Ratio 2.6 <5.0 (calc)   Non-HDL Cholesterol (Calc) 101 <130 mg/dL (calc)    Comment: For patients with diabetes plus 1 major ASCVD risk  factor, treating to a non-HDL-C goal of <100 mg/dL  (LDL-C of <70 mg/dL) is considered a therapeutic  option.   TSH     Status: None   Collection Time: 03/14/21 12:00 AM  Result Value Ref Range   TSH 1.27 0.40 - 4.50 mIU/L  PSA, Total with Reflex to PSA, Free     Status: None   Collection Time: 03/14/21 12:00 AM  Result Value Ref Range   PSA, Total 0.4 < OR = 4.0 ng/mL    Comment: The Total PSA value from this assay system is  standardized against the equimolar PSA standard.  The test result will be approximately 20% higher  when compared to the WAvita OntarioTotal PSA  (Siemens assay). Comparison of serial PSA results  should be interpreted with this fact in mind. .Marland KitchenPSA was performed using the Beckman Coulter  Immunoassay method. Values obtained from different  assay methods cannot be used interchangeably. PSA  levels,  regardless of value, should not be  interpreted as absolute evidence of the presence or  absence of disease.   Hepatitis C antibody     Status: None   Collection Time: 03/14/21 12:00 AM  Result Value Ref Range   Hepatitis C Ab NON-REACTIVE NON-REACTIVE   SIGNAL TO CUT-OFF 0.01 <1.00     Comment: . HCV antibody was non-reactive. There is no laboratory  evidence of HCV infection. . In most cases, no further action is required. However, if recent HCV exposure is suspected, a test for HCV RNA (test code [id number]) is suggested. . For additional information please refer to http://education.questdiagnostics.com/faq/FAQ22v1 (This link is being provided for informational/ educational purposes only.) .   HIV Antibody (routine testing w rflx)     Status: None   Collection Time: 03/14/21 12:00 AM  Result Value Ref Range   HIV 1&2 Ab, 4th Generation NON-REACTIVE NON-REACTIVE    Comment: HIV-1 antigen and HIV-1/HIV-2 antibodies were not detected. There is no laboratory evidence of HIV infection. Marland Kitchen PLEASE NOTE: This information has been disclosed to you from records whose confidentiality may be protected by state law.  If your state requires such protection, then the state law prohibits you from making any further disclosure of the information without the specific written consent of the person to whom it pertains, or as otherwise permitted by law. A general authorization for the release of medical or other information is NOT sufficient for this purpose. . For additional information please refer to http://education.questdiagnostics.com/faq/FAQ106 (This link is being provided for informational/ educational purposes only.) . Marland Kitchen The performance of this assay has not been clinically validated in patients less than 41 years old. .   B12 and Folate Panel     Status: None   Collection Time: 03/14/21 12:00 AM  Result Value Ref Range   Vitamin B-12 CANCELED     Comment: TEST NOT PERFORMED . The additional test requested cannot be performed due to either the age or quantity of specimen.  Result canceled by the ancillary.   POCT urinalysis dipstick     Status: None   Collection Time: 04/11/21  8:54 AM  Result Value Ref Range   Color, UA yellow yellow   Clarity, UA clear  clear   Glucose, UA negative negative mg/dL   Bilirubin, UA negative negative   Ketones, POC UA negative negative mg/dL   Spec Grav, UA 1.020 1.010 - 1.025   Blood, UA negative negative   pH, UA 5.5 5.0 - 8.0   Protein Ur, POC negative negative mg/dL   Urobilinogen, UA 0.2 0.2 or 1.0 E.U./dL   Nitrite, UA Negative Negative   Leukocytes, UA Negative Negative  COMPLETE METABOLIC PANEL WITH GFR     Status: Abnormal   Collection Time: 04/11/21  9:30 AM  Result Value Ref Range   Glucose, Bld 103 (H) 65 - 99 mg/dL    Comment: .            Fasting reference interval . For someone without known diabetes, a glucose value between 100 and 125 mg/dL is consistent with prediabetes and should be confirmed with a follow-up test. .    BUN 10 7 - 25 mg/dL   Creat 0.97 0.70 - 1.35 mg/dL   eGFR 88 > OR = 60 mL/min/1.14m    Comment: The eGFR is based on the CKD-EPI 2021 equation. To calculate  the new eGFR from a previous Creatinine or Cystatin C result, go to https://www.kidney.org/professionals/ kdoqi/gfr%5Fcalculator  BUN/Creatinine Ratio NOT APPLICABLE 6 - 22 (calc)   Sodium 137 135 - 146 mmol/L   Potassium 3.5 3.5 - 5.3 mmol/L   Chloride 95 (L) 98 - 110 mmol/L   CO2 32 20 - 32 mmol/L   Calcium 8.9 8.6 - 10.3 mg/dL   Total Protein 7.5 6.1 - 8.1 g/dL   Albumin 3.4 (L) 3.6 - 5.1 g/dL   Globulin 4.1 (H) 1.9 - 3.7 g/dL (calc)   AG Ratio 0.8 (L) 1.0 - 2.5 (calc)   Total Bilirubin 1.2 0.2 - 1.2 mg/dL   Alkaline phosphatase (APISO) 65 35 - 144 U/L   AST 13 10 - 35 U/L   ALT 10 9 - 46 U/L  CBC with Differential/Platelet     Status: Abnormal   Collection Time: 04/11/21  9:30 AM  Result Value Ref Range   WBC 9.0 3.8 - 10.8 Thousand/uL   RBC 4.88 4.20 - 5.80 Million/uL   Hemoglobin 17.8 (H) 13.2 - 17.1 g/dL   HCT 48.9 38.5 - 50.0 %   MCV 100.2 (H) 80.0 - 100.0 fL   MCH 36.5 (H) 27.0 - 33.0 pg   MCHC 36.4 (H) 32.0 - 36.0 g/dL   RDW 13.2 11.0 - 15.0 %   Platelets 290 140 - 400  Thousand/uL   MPV 11.2 7.5 - 12.5 fL   Neutro Abs 6,291 1,500 - 7,800 cells/uL   Lymphs Abs 1,629 850 - 3,900 cells/uL   Absolute Monocytes 810 200 - 950 cells/uL   Eosinophils Absolute 171 15 - 500 cells/uL   Basophils Absolute 99 0 - 200 cells/uL   Neutrophils Relative % 69.9 %   Total Lymphocyte 18.1 %   Monocytes Relative 9.0 %   Eosinophils Relative 1.9 %   Basophils Relative 1.1 %  TSH     Status: None   Collection Time: 04/11/21  9:30 AM  Result Value Ref Range   TSH 1.44 0.40 - 4.50 mIU/L  Brain natriuretic peptide     Status: None   Collection Time: 04/11/21  9:30 AM  Result Value Ref Range   Brain Natriuretic Peptide 86 <100 pg/mL    Comment: . BNP levels increase with age in the general population with the highest values seen in individuals greater than 41 years of age. Reference: J. Am. [place] Ar. Cardiol. 2002; 81:191-478. Marland Kitchen   Hemoglobin A1c     Status: None   Collection Time: 04/11/21  9:30 AM  Result Value Ref Range   Hgb A1c MFr Bld 5.2 <5.7 % of total Hgb    Comment: For the purpose of screening for the presence of diabetes: . <5.7%       Consistent with the absence of diabetes 5.7-6.4%    Consistent with increased risk for diabetes             (prediabetes) > or =6.5%  Consistent with diabetes . This assay result is consistent with a decreased risk of diabetes. . Currently, no consensus exists regarding use of hemoglobin A1c for diagnosis of diabetes in children. . According to American Diabetes Association (ADA) guidelines, hemoglobin A1c <7.0% represents optimal control in non-pregnant diabetic patients. Different metrics may apply to specific patient populations.  Standards of Medical Care in Diabetes(ADA). .    Mean Plasma Glucose 103 mg/dL   eAG (mmol/L) 5.7 mmol/L  Lipid panel     Status: None   Collection Time: 04/11/21  9:30 AM  Result Value Ref Range   Cholesterol 144 <200 mg/dL  HDL 55 > OR = 40 mg/dL   Triglycerides 137 <150 mg/dL    LDL Cholesterol (Calc) 67 mg/dL (calc)    Comment: Reference range: <100 . Desirable range <100 mg/dL for primary prevention;   <70 mg/dL for patients with CHD or diabetic patients  with > or = 2 CHD risk factors. Marland Kitchen LDL-C is now calculated using the Martin-Hopkins  calculation, which is a validated novel method providing  better accuracy than the Friedewald equation in the  estimation of LDL-C.  Cresenciano Genre et al. Annamaria Helling. 5974;163(84): 2061-2068  (http://education.[facility].com/faq/FAQ164)    Total CHOL/HDL Ratio 2.6 <5.0 (calc)   Non-HDL Cholesterol (Calc) 89 <130 mg/dL (calc)    Comment: For patients with diabetes plus 1 major ASCVD risk  factor, treating to a non-HDL-C goal of <100 mg/dL  (LDL-C of <70 mg/dL) is considered a therapeutic  option.   VITAMIN D 25 Hydroxy (Vit-D Deficiency, Fractures)     Status: None   Collection Time: 04/11/21  9:30 AM  Result Value Ref Range   Vit D, 25-Hydroxy 31 30 - 100 ng/mL    Comment: Vitamin D Status         25-OH Vitamin D: . Deficiency:                    <20 ng/mL Insufficiency:             20 - 29 ng/mL Optimal:                 > or = 30 ng/mL . For 25-OH Vitamin D testing on patients on  D2-supplementation and patients for whom quantitation  of D2 and D3 fractions is required, the QuestAssureD(TM) 25-OH VIT D, (D2,D3), LC/MS/MS is recommended: order  code [phone] (patients >52yr). See Note 1 . Note 1 . For additional information, please refer to  http://education.[facility].com/faq/FAQ199  (This link is being provided for informational/ educational purposes only.)     No results found.     All questions at time of visit were answered - patient instructed to contact office with any additional concerns or updates. ER/RTC precautions were reviewed with the patient as applicable.   Please note: manual typing as well as voice recognition software may have been used to produce this document - typos may escape  review. Please contact Dr. ASheppard Coilfor any needed clarifications.

## 2021-04-18 NOTE — Patient Instructions (Signed)
Referral has been ordered for cardiology! Please let us know if you haven't heard back within a week or so about scheduling an appointment. Someone should reach out to you. If you don't hear anything, please call our office and ask to speak to our referral coordinator!  I sent cream for legs, The skin redness is due to bad circulation from years of smoking. Work on increased waling!   YOU NEED TO TAKE YOUR INHALERS AND OTHER MEDICINES especially aspirin, atorvastatin.   I RECOMMEND OXYGEN.   Try Elavil for sleep.Given your breathing would NOT do heavy sedatives.

## 2021-04-21 ENCOUNTER — Telehealth: Payer: Self-pay

## 2021-04-21 DIAGNOSIS — J449 Chronic obstructive pulmonary disease, unspecified: Secondary | ICD-10-CM

## 2021-04-21 MED ORDER — FLUOCINOLONE ACETONIDE 0.01 % EX CREA: TOPICAL_CREAM | Freq: Three times a day (TID) | CUTANEOUS | 1 refills | Status: DC

## 2021-04-21 NOTE — Telephone Encounter (Signed)
Alternative sent, if still havign trouble affording (all these are generic) migth need to connect w/ clinical pharmacy

## 2021-04-21 NOTE — Telephone Encounter (Signed)
Patient left a vm msg on 04/18/21 stating he is unable to afford the triamcinolone cream. Can provider send in an alternative cream to the pharmacy?

## 2021-04-22 NOTE — Telephone Encounter (Signed)
Spoke with pt and he said it was not the cream he needed an alternative for, it was budesonide-formoterol (SYMBICORT) 160-4.5 MCG/ACT inhalerHe said this will cost him $55 a month and he cannot afford it.

## 2021-04-22 NOTE — Telephone Encounter (Signed)
Referred to clinical pharmacy to help w/ med assistance  There will not be a cheaper inhaler

## 2021-04-23 ENCOUNTER — Telehealth: Payer: Self-pay | Admitting: *Deleted

## 2021-04-23 ENCOUNTER — Telehealth: Payer: Self-pay

## 2021-04-23 NOTE — Chronic Care Management (AMB) (Signed)
  Care Management   Outreach Note  04/23/2021 Name: Ronnie Bowman MRN: [id number] DOB: 10/06/1955  Referred by: Sunnie Nielsen, DO Reason for referral : Care Coordination (Initial outreach to schedule referral with Pharm D)   An unsuccessful telephone outreach was attempted today. The patient was referred to the case management team for assistance with care management and care coordination.   Follow Up Plan:  If patient returns call to provider office, please advise to call Embedded Care Management Care Guide Kadance Mccuistion at [phone]  Burman Nieves, CCMA Care Guide, Embedded Care Coordination [facility]  Care Management  Direct Dial: [phone]

## 2021-04-23 NOTE — Telephone Encounter (Signed)
Pt aware of instructions and referral via voicemail. Instructed pt to call back with any questions or concerns.

## 2021-04-29 NOTE — Chronic Care Management (AMB) (Signed)
  Care Management   Outreach Note  04/29/2021 Name: Ronnie Bowman MRN: [id number] DOB: 1956/05/23  Referred by: Sunnie Nielsen, DO Reason for referral : Care Coordination (Initial outreach to schedule referral with Pharm D)   A second unsuccessful telephone outreach was attempted today. The patient was referred to the case management team for assistance with care management and care coordination.   Follow Up Plan:  A HIPAA compliant phone message was left for the patient providing contact information and requesting a return call.  If patient returns call to provider office, please advise to call Embedded Care Management Care Guide Crista Nuon at [phone]  Burman Nieves, CCMA Care Guide, Embedded Care Coordination [facility]  Care Management  Direct Dial: [phone]

## 2021-05-07 NOTE — Chronic Care Management (AMB) (Signed)
  Care Management   Outreach Note  05/07/2021 Name: Ronnie Bowman MRN: [id number] DOB: 11/09/1955  Referred by: Sunnie Nielsen, DO Reason for referral : Care Coordination (Initial outreach to schedule referral with Pharm D)   Third unsuccessful telephone outreach was attempted today. The patient was referred to the case management team for assistance with care management and care coordination. The patient's primary care provider has been notified of our unsuccessful attempts to make or maintain contact with the patient. The care management team is pleased to engage with this patient at any time in the future should he/she be interested in assistance from the care management team.   Follow Up Plan:  We have been unable to make contact with the patient for follow up. The care management team is available to follow up with the patient after provider conversation with the patient regarding recommendation for care management engagement and subsequent re-referral to the care management team.   Ronnie Bowman, CCMA Care Guide, Embedded Care Coordination [facility]  Care Management  Direct Dial: [phone]

## 2021-05-12 NOTE — Telephone Encounter (Signed)
Per provider [facility] lab ordered by mistake

## 2021-05-30 ENCOUNTER — Ambulatory Visit: Payer: 59 | Admitting: Family Medicine

## 2021-06-18 ENCOUNTER — Other Ambulatory Visit: Payer: Self-pay | Admitting: Osteopathic Medicine

## 2021-07-30 ENCOUNTER — Other Ambulatory Visit: Payer: Self-pay | Admitting: Osteopathic Medicine

## 2021-08-05 ENCOUNTER — Other Ambulatory Visit: Payer: Self-pay | Admitting: Physician Assistant

## 2021-08-21 ENCOUNTER — Ambulatory Visit: Payer: 59 | Admitting: Family Medicine

## 2021-08-26 ENCOUNTER — Telehealth: Payer: Self-pay

## 2021-08-26 NOTE — Telephone Encounter (Signed)
He has COPD, I think he needs more than just covering up with cough syrup, he needs an appointment with any one of the providers here that has an opening, and I do suspect he will need antibiotics and additional steroids.

## 2021-08-26 NOTE — Telephone Encounter (Signed)
Please forward to covering provider.

## 2021-08-26 NOTE — Telephone Encounter (Signed)
Pt lvm stating persistent cough that is keeping him up at night. Seen at [facility]. Received tessalon. Not working. Requesting cough syrup.

## 2021-08-27 NOTE — Telephone Encounter (Signed)
Front desk attempted to contact the patient to schedule next available appt.

## 2021-08-27 NOTE — Telephone Encounter (Signed)
Attempted to call patient yesterday afternoon to schedule an appointment, he stated he works and he has an apppointment on February 3rd but that he can not get away from work for an appointment before then. Offered patient a virtual or a telephone call today and the times I offered he said he could not do that. That he has to work, it was 4:45pm and he said "Dr T can give me a call now, I am on my way home" I said "we are about to close, we close at 5 and there are no appointments available at this time of the day." Patient still stated he could not do an appointment or a virtual, he just wanted medication to be called in. He stated " I guess I will just cough all night then, the hell with it " but denied making an appointment today due to work.

## 2021-08-28 ENCOUNTER — Encounter: Payer: Self-pay | Admitting: Family Medicine

## 2021-08-28 ENCOUNTER — Ambulatory Visit (INDEPENDENT_AMBULATORY_CARE_PROVIDER_SITE_OTHER): Payer: 59 | Admitting: Family Medicine

## 2021-08-28 ENCOUNTER — Other Ambulatory Visit: Payer: Self-pay

## 2021-08-28 VITALS — BP 110/65 | HR 63 | Ht 69.0 in | Wt 209.0 lb

## 2021-08-28 DIAGNOSIS — J449 Chronic obstructive pulmonary disease, unspecified: Secondary | ICD-10-CM | POA: Diagnosis not present

## 2021-08-28 DIAGNOSIS — R051 Acute cough: Secondary | ICD-10-CM

## 2021-08-28 DIAGNOSIS — J441 Chronic obstructive pulmonary disease with (acute) exacerbation: Secondary | ICD-10-CM | POA: Diagnosis not present

## 2021-08-28 MED ORDER — DOXYCYCLINE HYCLATE 100 MG PO TABS: 100.0000 mg | ORAL_TABLET | Freq: Two times a day (BID) | ORAL | 0 refills | Status: AC

## 2021-08-28 MED ORDER — HYDROCODONE BIT-HOMATROP MBR 5-1.5 MG/5ML PO SOLN: 5.0000 mL | Freq: Three times a day (TID) | ORAL | 0 refills | Status: DC | PRN

## 2021-08-28 MED ORDER — METHYLPREDNISOLONE SODIUM SUCC 125 MG IJ SOLR
125.0000 mg | Freq: Once | INTRAMUSCULAR | Status: AC
  Administered 2021-08-28: 125 mg via INTRAMUSCULAR

## 2021-08-28 MED ORDER — PREDNISONE 50 MG PO TABS: ORAL_TABLET | ORAL | 0 refills | Status: DC

## 2021-08-28 NOTE — Assessment & Plan Note (Signed)
Refused O2 in the clinic today.  Given injection of Solu-Medrol 125 mg in clinic today.  We will add prednisone burst at 50 mg x 5 days.  Adding course of doxycycline as well.  Hycodan cough syrup as needed.  Follow-up in 1 week.  Instructed to seek emergency care if having worsening symptoms.

## 2021-08-28 NOTE — Patient Instructions (Signed)
Start prednisone 50mg  daily x5 days.  Start doxycycline 100mg  twice daily for 10 days.  Let me know if not improving.  If worsening get checked out at the hospital.  Keep appt with me on 09/05/21.

## 2021-08-28 NOTE — Progress Notes (Signed)
Ronnie Bowman - 66 y.o. male MRN [id number]  Date of birth: 08/17/1955  Subjective Chief Complaint  Patient presents with   Cough    HPI Ronnie Bowman is a 66 year old male here today for follow-up of urgent care visit.  Seen in urgent care recently for COPD exacerbation.  He was treated with steroids with some improvement but continues to have harsh cough.  O2 sats today in clinic initially 79% with improvement to 85% at rest.  Offered oxygen however patient refused.  He has noted some increased wheezing and mild dyspnea.  He does not feel short of breath with current sats.  He denies chest pain, fever, chills.  COVID testing has been negative.  He is using albuterol as needed.  He is not using Symbicort due to cost of medication.  ROS:  A comprehensive ROS was completed and negative except as noted per HPI  No Known Allergies  Past Medical History:  Diagnosis Date   Acute pulmonary embolism (HCC)    CHF (congestive heart failure) (HCC)    Community acquired pneumonia of left lung 04/15/2018   COPD (chronic obstructive pulmonary disease) (HCC)    Hypertension     History reviewed. No pertinent surgical history.  Social History   Socioeconomic History   Marital status: Single    Spouse name: Not on file   Number of children: Not on file   Years of education: Not on file   Highest education level: Not on file  Occupational History   Not on file  Tobacco Use   Smoking status: Former    Packs/day: 2.00    Years: 45.00    Pack years: 90.00    Types: Cigarettes    Quit date: 11/01/2017    Years since quitting: 3.8   Smokeless tobacco: Never  Vaping Use   Vaping Use: Former  Substance and Sexual Activity   Alcohol use: Yes    Alcohol/week: 3.0 standard drinks    Types: 3 Standard drinks or equivalent per week    Comment: 5th a week   Drug use: Never   Sexual activity: Yes    Birth control/protection: None  Other Topics Concern   Not on file  Social History Narrative   Not on  file   Social Determinants of Health   Financial Resource Strain: Not on file  Food Insecurity: Not on file  Transportation Needs: Not on file  Physical Activity: Not on file  Stress: Not on file  Social Connections: Not on file    Family History  Problem Relation Age of Onset   Leukemia Mother    Heart failure Father     Health Maintenance  Topic Date Due   COVID-19 Vaccine (1) Never done   COLONOSCOPY (Pts 45-26yrs Insurance coverage will need to be confirmed)  Never done   Zoster Vaccines- Shingrix (1 of 2) Never done   TETANUS/TDAP  12/25/2019   INFLUENZA VACCINE  Completed   Hepatitis C Screening  Completed   HIV Screening  Completed   HPV VACCINES  Aged Out     ----------------------------------------------------------------------------------------------------------------------------------------------------------------------------------------------------------------- Physical Exam BP 110/65 (BP Location: Left Arm, Patient Position: Sitting, Cuff Size: Normal)    Pulse 63    Ht 5\' 9"  (1.753 m)    Wt 209 lb (94.8 kg)    SpO2 (!) 83%    BMI 30.86 kg/m   Physical Exam Constitutional:      Appearance: Normal appearance.  Eyes:     General: No scleral icterus. Cardiovascular:  Rate and Rhythm: Normal rate and regular rhythm.  Pulmonary:     Breath sounds: Wheezing present.  Musculoskeletal:     Cervical back: Neck supple.  Neurological:     General: No focal deficit present.     Mental Status: He is alert.  Psychiatric:        Mood and Affect: Mood normal.        Behavior: Behavior normal.    ------------------------------------------------------------------------------------------------------------------------------------------------------------------------------------------------------------------- Assessment and Plan  COPD with exacerbation (HCC) Refused O2 in the clinic today.  Given injection of Solu-Medrol 125 mg in clinic today.  We will add  prednisone burst at 50 mg x 5 days.  Adding course of doxycycline as well.  Hycodan cough syrup as needed.  Follow-up in 1 week.  Instructed to seek emergency care if having worsening symptoms.   Meds ordered this encounter  Medications   predniSONE (DELTASONE) 50 MG tablet    Sig: Take 1 tab po daily x5 days.    Dispense:  5 tablet    Refill:  0   HYDROcodone bit-homatropine (HYCODAN) 5-1.5 MG/5ML syrup    Sig: Take 5 mLs by mouth every 8 (eight) hours as needed for cough.    Dispense:  120 mL    Refill:  0   doxycycline (VIBRA-TABS) 100 MG tablet    Sig: Take 1 tablet (100 mg total) by mouth 2 (two) times daily for 10 days.    Dispense:  20 tablet    Refill:  0   methylPREDNISolone sodium succinate (SOLU-MEDROL) 125 mg/2 mL injection 125 mg    No follow-ups on file.    This visit occurred during the SARS-CoV-2 public health emergency.  Safety protocols were in place, including screening questions prior to the visit, additional usage of staff PPE, and extensive cleaning of exam room while observing appropriate contact time as indicated for disinfecting solutions.

## 2021-08-28 NOTE — Progress Notes (Signed)
Pt's O2 Sats were 79% and increased to 83%. Offered pt 2 liters of O2 per Dr. Ashley Royalty. Pt declined oxygen.

## 2021-09-05 ENCOUNTER — Other Ambulatory Visit: Payer: Self-pay

## 2021-09-05 ENCOUNTER — Ambulatory Visit: Payer: 59 | Admitting: Family Medicine

## 2021-09-05 ENCOUNTER — Encounter: Payer: Self-pay | Admitting: Family Medicine

## 2021-09-05 DIAGNOSIS — J449 Chronic obstructive pulmonary disease, unspecified: Secondary | ICD-10-CM | POA: Diagnosis not present

## 2021-09-05 DIAGNOSIS — I10 Essential (primary) hypertension: Secondary | ICD-10-CM

## 2021-09-05 MED ORDER — TRELEGY ELLIPTA 100-62.5-25 MCG/ACT IN AEPB: 1.0000 | INHALATION_SPRAY | Freq: Every day | RESPIRATORY_TRACT | 11 refills | Status: DC

## 2021-09-05 NOTE — Patient Instructions (Addendum)
Let's try change to trelegy to replace symbicort Continue all other medications.

## 2021-09-07 NOTE — Assessment & Plan Note (Signed)
Blood pressure well controlled at this time.  Recommend continuation of current medications at current strength. 

## 2021-09-07 NOTE — Assessment & Plan Note (Signed)
Recent exacerbation has resolved.  We will try switching to Trelegy to see if this is more affordable for him as well as provide better control of symptoms.  Continue albuterol as needed.  His O2 sats remained low however he declines home oxygen.  Discussed risks of chronic hypoxemia.

## 2021-09-07 NOTE — Progress Notes (Signed)
Ronnie Bowman - 66 y.o. male MRN [id number]  Date of birth: July 13, 1956  Subjective No chief complaint on file.   HPI Ronnie Bowman is a 66 year old male here today for follow-up of recent COPD exacerbation.  Reports he is feeling much better.  He has completed course of prednisone and has nearly finished with course of doxycycline.  O2 sats remain low however he reports that he feels fine.  He does not want home oxygen.  He is asking for a different controller inhaler as he reports that Symbicort is too expensive.  His blood pressures remain well controlled with lisinopril, carvedilol, amlodipine and Aldactone.  He denies chest pain,  palpitations, headaches or vision changes.  ROS:  A comprehensive ROS was completed and negative except as noted per HPI  No Known Allergies  Past Medical History:  Diagnosis Date   Acute pulmonary embolism (HCC)    CHF (congestive heart failure) ([place])    Community acquired pneumonia of left lung 04/15/2018   COPD (chronic obstructive pulmonary disease) ([place])    Hypertension     History reviewed. No pertinent surgical history.  Social History   Socioeconomic History   Marital status: Single    Spouse name: Not on file   Number of children: Not on file   Years of education: Not on file   Highest education level: Not on file  Occupational History   Not on file  Tobacco Use   Smoking status: Former    Packs/day: 2.00    Years: 45.00    Pack years: 90.00    Types: Cigarettes    Quit date: 11/01/2017    Years since quitting: 3.8   Smokeless tobacco: Never  Vaping Use   Vaping Use: Former  Substance and Sexual Activity   Alcohol use: Yes    Alcohol/week: 3.0 standard drinks    Types: 3 Standard drinks or equivalent per week    Comment: 5th a week   Drug use: Never   Sexual activity: Yes    Birth control/protection: None  Other Topics Concern   Not on file  Social History Narrative   Not on file   Social Determinants of Health   Financial  Resource Strain: Not on file  Food Insecurity: Not on file  Transportation Needs: Not on file  Physical Activity: Not on file  Stress: Not on file  Social Connections: Not on file    Family History  Problem Relation Age of Onset   Leukemia Mother    Heart failure Father     Health Maintenance  Topic Date Due   COLONOSCOPY (Pts 45-29yrs Insurance coverage will need to be confirmed)  Never done   TETANUS/TDAP  09/05/2022 (Originally 12/25/2019)   Zoster Vaccines- Shingrix (1 of 2) 12/04/2022 (Originally 06/14/2008)   INFLUENZA VACCINE  Completed   Hepatitis C Screening  Completed   HIV Screening  Completed   HPV VACCINES  Aged Out   COVID-19 Vaccine  Discontinued     ----------------------------------------------------------------------------------------------------------------------------------------------------------------------------------------------------------------- Physical Exam BP 114/71 (BP Location: Right Arm, Patient Position: Sitting, Cuff Size: Large)    Pulse 70    Wt 205 lb (93 kg)    SpO2 (!) 87%    BMI 30.27 kg/m   Physical Exam Constitutional:      Appearance: Normal appearance.  Eyes:     General: No scleral icterus. Cardiovascular:     Rate and Rhythm: Normal rate and regular rhythm.  Pulmonary:     Effort: Pulmonary effort is normal.  Breath sounds: Normal breath sounds.  Musculoskeletal:     Cervical back: Neck supple.  Neurological:     General: No focal deficit present.     Mental Status: He is alert.  Psychiatric:        Mood and Affect: Mood normal.        Behavior: Behavior normal.    ------------------------------------------------------------------------------------------------------------------------------------------------------------------------------------------------------------------- Assessment and Plan  Hypertension goal BP (blood pressure) < 130/80 Blood pressure well controlled at this time.  Recommend continuation of  current medications at current strength.  Chronic obstructive pulmonary disease (HCC) Recent exacerbation has resolved.  We will try switching to Trelegy to see if this is more affordable for him as well as provide better control of symptoms.  Continue albuterol as needed.  His O2 sats remained low however he declines home oxygen.  Discussed risks of chronic hypoxemia.   Meds ordered this encounter  Medications   Fluticasone-Umeclidin-Vilant (TRELEGY ELLIPTA) 100-62.5-25 MCG/ACT AEPB    Sig: Inhale 1 puff into the lungs daily.    Dispense:  1 each    Refill:  11    Return in about 3 months (around 12/03/2021) for HTN/COPD.    This visit occurred during the SARS-CoV-2 public health emergency.  Safety protocols were in place, including screening questions prior to the visit, additional usage of staff PPE, and extensive cleaning of exam room while observing appropriate contact time as indicated for disinfecting solutions.

## 2021-09-12 ENCOUNTER — Telehealth: Payer: Self-pay

## 2021-09-12 ENCOUNTER — Encounter: Payer: Self-pay | Admitting: Medical-Surgical

## 2021-09-12 NOTE — Telephone Encounter (Signed)
Pt lvm stating he was able to receive the Trelegy Ellipta inhaler with coupon.   States his breathing is terrible. He's taken Mucinex and the inhaler. It's not helping. He's tested twice for COVID = negative.   Please advise.  Sent to [place] [facility].

## 2021-09-13 DIAGNOSIS — E871 Hypo-osmolality and hyponatremia: Secondary | ICD-10-CM | POA: Insufficient documentation

## 2021-09-13 DIAGNOSIS — R7989 Other specified abnormal findings of blood chemistry: Secondary | ICD-10-CM | POA: Insufficient documentation

## 2021-09-14 DIAGNOSIS — J159 Unspecified bacterial pneumonia: Secondary | ICD-10-CM | POA: Insufficient documentation

## 2021-09-17 ENCOUNTER — Telehealth: Payer: Self-pay | Admitting: General Practice

## 2021-09-17 NOTE — Telephone Encounter (Signed)
Transition Care Management Unsuccessful Follow-up Telephone Call  Date of discharge and from where:  09/15/21 from [facility]  Attempts:  1st Attempt  Reason for unsuccessful TCM follow-up call:  No answer/busy Has hospital follow up scheduled for 09/26/21.

## 2021-09-19 NOTE — Telephone Encounter (Signed)
Transition Care Management Unsuccessful Follow-up Telephone Call ° °Date of discharge and from where:  09/15/21 from [facility] ° °Attempts:  2nd Attempt ° °Reason for unsuccessful TCM follow-up call:  No answer/busy ° °  °

## 2021-09-24 NOTE — Telephone Encounter (Signed)
Transition Care Management Unsuccessful Follow-up Telephone Call ° °Date of discharge and from where:  09/15/21 from [facility] ° °Attempts:  3rd Attempt ° °Reason for unsuccessful TCM follow-up call:  No answer/busy ° °  °

## 2021-09-26 ENCOUNTER — Encounter: Payer: Self-pay | Admitting: Family Medicine

## 2021-09-26 ENCOUNTER — Other Ambulatory Visit: Payer: Self-pay

## 2021-09-26 ENCOUNTER — Ambulatory Visit (INDEPENDENT_AMBULATORY_CARE_PROVIDER_SITE_OTHER): Payer: 59 | Admitting: Family Medicine

## 2021-09-26 VITALS — BP 97/60 | HR 70 | Ht 69.0 in | Wt 206.0 lb

## 2021-09-26 DIAGNOSIS — J159 Unspecified bacterial pneumonia: Secondary | ICD-10-CM

## 2021-09-26 DIAGNOSIS — N179 Acute kidney failure, unspecified: Secondary | ICD-10-CM | POA: Diagnosis not present

## 2021-09-26 DIAGNOSIS — J449 Chronic obstructive pulmonary disease, unspecified: Secondary | ICD-10-CM | POA: Diagnosis not present

## 2021-09-26 DIAGNOSIS — I10 Essential (primary) hypertension: Secondary | ICD-10-CM | POA: Diagnosis not present

## 2021-09-26 LAB — CBC WITH DIFFERENTIAL/PLATELET
Absolute Monocytes: 908 cells/uL (ref 200–950)
Basophils Absolute: 83 cells/uL (ref 0–200)
Basophils Relative: 1.1 %
Eosinophils Absolute: 278 cells/uL (ref 15–500)
Eosinophils Relative: 3.7 %
HCT: 48.7 % (ref 38.5–50.0)
Hemoglobin: 16.7 g/dL (ref 13.2–17.1)
Lymphs Abs: 1808 cells/uL (ref 850–3900)
MCH: 33.3 pg — ABNORMAL HIGH (ref 27.0–33.0)
MCHC: 34.3 g/dL (ref 32.0–36.0)
MCV: 97.2 fL (ref 80.0–100.0)
MPV: 10.9 fL (ref 7.5–12.5)
Monocytes Relative: 12.1 %
Neutro Abs: 4425 cells/uL (ref 1500–7800)
Neutrophils Relative %: 59 %
Platelets: 261 10*3/uL (ref 140–400)
RBC: 5.01 10*6/uL (ref 4.20–5.80)
RDW: 14.3 % (ref 11.0–15.0)
Total Lymphocyte: 24.1 %
WBC: 7.5 10*3/uL (ref 3.8–10.8)

## 2021-09-26 LAB — COMPLETE METABOLIC PANEL WITH GFR
AG Ratio: 0.9 (calc) — ABNORMAL LOW (ref 1.0–2.5)
ALT: 19 U/L (ref 9–46)
AST: 14 U/L (ref 10–35)
Albumin: 3.4 g/dL — ABNORMAL LOW (ref 3.6–5.1)
Alkaline phosphatase (APISO): 63 U/L (ref 35–144)
BUN: 16 mg/dL (ref 7–25)
CO2: 29 mmol/L (ref 20–32)
Calcium: 9.2 mg/dL (ref 8.6–10.3)
Chloride: 101 mmol/L (ref 98–110)
Creat: 0.98 mg/dL (ref 0.70–1.35)
Globulin: 3.9 g/dL (calc) — ABNORMAL HIGH (ref 1.9–3.7)
Glucose, Bld: 86 mg/dL (ref 65–99)
Potassium: 4.3 mmol/L (ref 3.5–5.3)
Sodium: 137 mmol/L (ref 135–146)
Total Bilirubin: 1.5 mg/dL — ABNORMAL HIGH (ref 0.2–1.2)
Total Protein: 7.3 g/dL (ref 6.1–8.1)
eGFR: 87 mL/min/{1.73_m2} (ref >=60)

## 2021-09-26 MED ORDER — LISINOPRIL 40 MG PO TABS: ORAL_TABLET | ORAL | 1 refills | Status: DC

## 2021-09-26 MED ORDER — FUROSEMIDE 20 MG PO TABS: 20.0000 mg | ORAL_TABLET | Freq: Two times a day (BID) | ORAL | 1 refills | Status: DC

## 2021-09-26 MED ORDER — SPIRONOLACTONE 50 MG PO TABS: ORAL_TABLET | ORAL | 1 refills | Status: DC

## 2021-09-26 MED ORDER — HYDROCODONE BIT-HOMATROP MBR 5-1.5 MG/5ML PO SOLN
5.0000 mL | Freq: Four times a day (QID) | ORAL | 0 refills | Status: DC | PRN
Start: 2021-09-26 — End: 2022-03-13

## 2021-09-26 MED ORDER — CARVEDILOL 25 MG PO TABS: 25.0000 mg | ORAL_TABLET | Freq: Two times a day (BID) | ORAL | 1 refills | Status: DC

## 2021-09-26 NOTE — Assessment & Plan Note (Signed)
He has restarted medications.  BP is a little low.  Reduce amlodipine to 5mg .  Continue coreg and lisinopril.  Rechecking renal function today.

## 2021-09-26 NOTE — Progress Notes (Signed)
[name] - 66 y.o. male MRN [id number]  Date of birth: 25-Oct-1955  Subjective No chief complaint on file.   HPI Ronnie Bowman is a 66 y.o. male here today for hospital follow up.  Recently admitted to [facility] due to worsening COPD exacerbation and  bibasilar pneumonia.  Needing O2 at 8L initially and was able to to be reduced down to 2L shortly afterwards.  VQ scan and LE doppler negative. Treated with azithromycin and Ceftriaxone.  Transitioned to ceftin to complete course at discharge.  Qualified for home O2 based on RT assessment. He has this at home but is not using.  Continued on albuterol and trelegy at discharge.  Overall feels better but still has cough.  Denies dyspnea.   BP medications held due to hypotension and AKI.  Echo with preserved EF of 60-65%. He reports that he has restarted BP medications.  Denies symptoms of dizziness or lightheadedness.   He denies chest pain.    ROS:  A comprehensive ROS was completed and negative except as noted per HPI  No Known Allergies  Past Medical History:  Diagnosis Date   Acute pulmonary embolism (HCC)    CHF (congestive heart failure) (HCC)    Community acquired pneumonia of left lung 04/15/2018   COPD (chronic obstructive pulmonary disease) (HCC)    Hypertension     No past surgical history on file.  Social History   Socioeconomic History   Marital status: Single    Spouse name: Not on file   Number of children: Not on file   Years of education: Not on file   Highest education level: Not on file  Occupational History   Not on file  Tobacco Use   Smoking status: Former    Packs/day: 2.00    Years: 45.00    Pack years: 90.00    Types: Cigarettes    Quit date: 11/01/2017    Years since quitting: 3.9   Smokeless tobacco: Never  Vaping Use   Vaping Use: Former  Substance and Sexual Activity   Alcohol use: Yes    Alcohol/week: 3.0 standard drinks    Types: 3 Standard drinks or equivalent per week     Comment: 5th a week   Drug use: Never   Sexual activity: Yes    Birth control/protection: None  Other Topics Concern   Not on file  Social History Narrative   Not on file   Social Determinants of Health   Financial Resource Strain: Not on file  Food Insecurity: Not on file  Transportation Needs: Not on file  Physical Activity: Not on file  Stress: Not on file  Social Connections: Not on file    Family History  Problem Relation Age of Onset   Leukemia Mother    Heart failure Father     Health Maintenance  Topic Date Due   COLONOSCOPY (Pts 45-27yrs Insurance coverage will need to be confirmed)  Never done   TETANUS/TDAP  09/05/2022 (Originally 12/25/2019)   Zoster Vaccines- Shingrix (1 of 2) 12/04/2022 (Originally 06/14/2008)   INFLUENZA VACCINE  Completed   Hepatitis C Screening  Completed   HIV Screening  Completed   HPV VACCINES  Aged Out   COVID-19 Vaccine  Discontinued     ----------------------------------------------------------------------------------------------------------------------------------------------------------------------------------------------------------------- Physical Exam BP 97/60    Pulse 70    Ht 5\' 9"  (1.753 m)    Wt 206 lb (93.4 kg)    SpO2 (!) 86%    BMI 30.42 kg/m  Physical Exam Constitutional:      Appearance: Normal appearance.  Eyes:     General: No scleral icterus. Cardiovascular:     Rate and Rhythm: Normal rate and regular rhythm.  Pulmonary:     Effort: Pulmonary effort is normal.     Breath sounds: Normal breath sounds.  Musculoskeletal:     Cervical back: Neck supple.  Neurological:     General: No focal deficit present.     Mental Status: He is alert.  Psychiatric:        Mood and Affect: Mood normal.        Behavior: Behavior normal.     ------------------------------------------------------------------------------------------------------------------------------------------------------------------------------------------------------------------- Assessment and Plan  Bacterial lobar pneumonia Completed course of antibiotics with improvement of symptoms.    Chronic obstructive pulmonary disease (HCC) Continue trelegy with albuterol as needed.  Has home O2 and I encouraged him to use this.  Referral to pulmonology.   Hypertension goal BP (blood pressure) < 130/80 He has restarted medications.  BP is a little low.  Reduce amlodipine to 5mg .  Continue coreg and lisinopril.  Rechecking renal function today.    No orders of the defined types were placed in this encounter.   No follow-ups on file.    This visit occurred during the SARS-CoV-2 public health emergency.  Safety protocols were in place, including screening questions prior to the visit, additional usage of staff PPE, and extensive cleaning of exam room while observing appropriate contact time as indicated for disinfecting solutions.

## 2021-09-26 NOTE — Patient Instructions (Signed)
Use oxygen at home.  Continue current inhalers.  Let me know if symptoms worsen again.  I have entered a referral to a lung doctor as well.

## 2021-09-26 NOTE — Assessment & Plan Note (Addendum)
Continue trelegy with albuterol as needed.  Has home O2 and I encouraged him to use this.  Referral to pulmonology.

## 2021-09-26 NOTE — Assessment & Plan Note (Signed)
Completed course of antibiotics with improvement of symptoms.

## 2021-10-09 ENCOUNTER — Other Ambulatory Visit: Payer: Self-pay | Admitting: Physician Assistant

## 2021-11-14 ENCOUNTER — Telehealth: Payer: 59 | Admitting: Physician Assistant

## 2021-11-14 ENCOUNTER — Other Ambulatory Visit: Payer: Self-pay

## 2021-11-14 DIAGNOSIS — J019 Acute sinusitis, unspecified: Secondary | ICD-10-CM | POA: Diagnosis not present

## 2021-11-14 DIAGNOSIS — B9689 Other specified bacterial agents as the cause of diseases classified elsewhere: Secondary | ICD-10-CM | POA: Diagnosis not present

## 2021-11-14 DIAGNOSIS — J441 Chronic obstructive pulmonary disease with (acute) exacerbation: Secondary | ICD-10-CM | POA: Diagnosis not present

## 2021-11-14 MED ORDER — PREDNISONE 10 MG (21) PO TBPK: ORAL_TABLET | ORAL | 0 refills | Status: DC

## 2021-11-14 MED ORDER — CEFUROXIME AXETIL 500 MG PO TABS: 500.0000 mg | ORAL_TABLET | Freq: Two times a day (BID) | ORAL | 0 refills | Status: AC

## 2021-11-14 MED ORDER — AMITRIPTYLINE HCL 75 MG PO TABS: ORAL_TABLET | ORAL | 0 refills | Status: DC

## 2021-11-14 NOTE — Patient Instructions (Signed)
Ronnie Bowman, thank you for joining Ronnie LovelessJennifer M Timira Bieda, PA-C for today's virtual visit.  While this provider is not your primary care provider (PCP), if your PCP is located in our provider database this encounter information will be shared with them immediately following your visit. ? ?Consent: ?(Patient) Ronnie RhymeJimmy Bowman provided verbal consent for this virtual visit at the beginning of the encounter. ? ?Current Medications: ? ?Current Outpatient Medications:  ?  cefUROXime (CEFTIN) 500 MG tablet, Take 1 tablet (500 mg total) by mouth 2 (two) times daily with a meal for 7 days., Disp: 14 tablet, Rfl: 0 ?  predniSONE (STERAPRED UNI-PAK 21 TAB) 10 MG (21) TBPK tablet, 6 day taper; take as directed on package instructions, Disp: 21 tablet, Rfl: 0 ?  albuterol (VENTOLIN HFA) 108 (90 Base) MCG/ACT inhaler, Inhale 2 puffs into the lungs every 6 (six) hours as needed for wheezing., Disp: 2 each, Rfl: 11 ?  AMBULATORY NON FORMULARY MEDICATION, Take 2 each by mouth every morning. Medication Name: Goli gummies, Disp: , Rfl:  ?  amitriptyline (ELAVIL) 75 MG tablet, TAKE 1 TO 2 TABLETS BY MOUTH AT BEDTIME AS NEEDED FOR SLEEP, Disp: 60 tablet, Rfl: 0 ?  amLODipine (NORVASC) 10 MG tablet, Take 1 tablet (10 mg total) by mouth daily. (Patient taking differently: Take 5 mg by mouth daily.), Disp: 90 tablet, Rfl: 1 ?  aspirin EC 81 MG tablet, Take 1 tablet (81 mg total) by mouth daily., Disp: 90 tablet, Rfl: 3 ?  atorvastatin (LIPITOR) 20 MG tablet, Take 1 tablet (20 mg total) by mouth at bedtime., Disp: 90 tablet, Rfl: 3 ?  carvedilol (COREG) 25 MG tablet, Take 1 tablet (25 mg total) by mouth 2 (two) times daily with a meal., Disp: 180 tablet, Rfl: 1 ?  fluocinolone (VANOS) 0.01 % cream, Apply topically 3 (three) times daily. To affected area(s) as needed three times per day for redness/irritation lower legs, can use once weekly as maintenance/prevention, Disp: 120 g, Rfl: 1 ?  Fluticasone-Umeclidin-Vilant (TRELEGY ELLIPTA)  100-62.5-25 MCG/ACT AEPB, Inhale 1 puff into the lungs daily., Disp: 1 each, Rfl: 11 ?  furosemide (LASIX) 20 MG tablet, Take 1 tablet (20 mg total) by mouth 2 (two) times daily. Increase to 2 tablets (40 mg) bid for no more than 3 days prn weight gain 5 lbs over 48 hours OR increased lower extremity swelling, Disp: 180 tablet, Rfl: 1 ?  HYDROcodone bit-homatropine (HYDROMET) 5-1.5 MG/5ML syrup, Take 5 mLs by mouth every 6 (six) hours as needed for cough., Disp: 120 mL, Rfl: 0 ?  lisinopril (ZESTRIL) 40 MG tablet, Take 1 tablet by mouth once daily., Disp: 90 tablet, Rfl: 1 ?  Melatonin 10 MG CAPS, Take 10 mg by mouth., Disp: , Rfl:  ?  nitroGLYCERIN (NITROSTAT) 0.4 MG SL tablet, Place 1 tablet (0.4 mg total) under the tongue every 5 (five) minutes as needed for chest pain., Disp: 30 tablet, Rfl: 3 ?  spironolactone (ALDACTONE) 50 MG tablet, Take 1 tablet by mouth once daily., Disp: 90 tablet, Rfl: 1  ? ?Medications ordered in this encounter:  ?Meds ordered this encounter  ?Medications  ? cefUROXime (CEFTIN) 500 MG tablet  ?  Sig: Take 1 tablet (500 mg total) by mouth 2 (two) times daily with a meal for 7 days.  ?  Dispense:  14 tablet  ?  Refill:  0  ?  Order Specific Question:   Supervising Provider  ?  Answer:   Eber HongMILLER, BRIAN [[id number]]  ? predniSONE (STERAPRED  UNI-PAK 21 TAB) 10 MG (21) TBPK tablet  ?  Sig: 6 day taper; take as directed on package instructions  ?  Dispense:  21 tablet  ?  Refill:  0  ?  Order Specific Question:   Supervising Provider  ?  Answer:   Eber Hong [[id number]]  ?  ? ?*If you need refills on other medications prior to your next appointment, please contact your pharmacy* ? ?Follow-Up: ?Call back or seek an in-person evaluation if the symptoms worsen or if the condition fails to improve as anticipated. ? ?Other Instructions ?Chronic Obstructive Pulmonary Disease Exacerbation ? ?Chronic obstructive pulmonary disease (COPD) is a long-term (chronic) lung problem. In COPD, the flow of air from the  lungs is limited. ?COPD exacerbations are times that breathing gets worse and you need more than your normal treatment. Without treatment, they can be life-threatening. If they happen often, your lungs can become more damaged. ?What are the causes? ?Having infections that affect your airways and lungs. ?Being exposed to: ?Smoke. ?Air pollution. ?Chemical fumes. ?Dust. ?Things that can cause an allergic reaction (allergens). ?Not taking your usual COPD medicines as told. ?Having medical problems already, such as heart failure or infections not involving the lungs. ?In many cases, the cause is not known. ?What increases the risk? ?Smoking. ?Being an older adult. ?Having frequent prior COPD exacerbations. ?What are the signs or symptoms? ?Increased coughing. ?Increased mucus from your lungs. ?Increased wheezing. ?Increased shortness of breath. ?Fast breathing and finding it hard to breathe. ?Chest tightness. ?Less energy than usual. ?Sleep disruption from symptoms. ?Confusion. ?Increased sleepiness. ?Often, these symptoms happen or get worse even with the use of medicines. ?How is this treated? ?Treatment for this condition depends on how bad it is and the cause of the symptoms. You may need to stay in the hospital for treatment. Treatment may include: ?Taking medicines. ?Using oxygen. ?Being treated with different ways to clear your airway, such as using a mask to deliver oxygen. ?Follow these instructions at home: ?Medicines ?Take over-the-counter and prescription medicines only as told by your doctor. ?Use all inhaled medicines the correct way. ?If you were prescribed an antibiotic or steroid medicine, take it as told by your doctor. Do not stop taking it even if you start to feel better. ?Lifestyle ?Do not smoke or use any products that contain nicotine or tobacco. If you need help quitting, ask your doctor. ?Eat healthy foods. ?Exercise regularly. ?Get enough sleep. Most adults need 7 or more hours per  night. ?Avoid tobacco smoke and other things that can bother your lungs. ?Several times a day, wash your hands with soap and water for at least 20 seconds. If you cannot use soap and water, use hand sanitizer. This may help keep you from getting an infection. ?During flu season, avoid areas that are crowded with people. ?General instructions ?Drink enough fluid to keep your pee (urine) pale yellow. Do not do this if your doctor has told you not to. ?Use a cool mist machine (vaporizer). ?If you use oxygen or a machine that turns medicine into a mist (nebulizer), continue to use it as told. ?Keep all follow-up visits. ?How is this prevented? ?Keep up with shots (vaccinations) as told by your doctor. Be sure to get a yearly flu (influenza) shot. ?If you smoke, quit smoking. Smoking makes the problem worse. ?Follow all instructions for rehabilitation. These are steps you can take to make your body work better. ?Work with your doctor to develop and follow an action  plan. This tells you what steps to take when you experience certain symptoms. ?Contact a doctor if: ?Your COPD symptoms get worse than normal. ?Get help right away if: ?You are short of breath and it gets worse, even when you are resting. ?You have trouble talking. ?You have chest pain. ?You cough up blood. ?You have a fever. ?You keep vomiting. ?You feel weak or you pass out (faint). ?You feel confused. ?You are not able to sleep because of your symptoms. ?You have trouble doing daily activities. ?These symptoms may be an emergency. Get help right away. Call your local emergency services (911 in the [place] ?Do not wait to see if the symptoms will go away. ?Do not drive yourself to the hospital. ?Summary ?COPD exacerbations are times that breathing gets worse and you need more treatment than normal. ?COPD exacerbations can be very serious and may cause your lungs to become more damaged. ?Do not smoke. If you need help quitting, ask your doctor. ?Stay up to  date on your shots. Get a flu shot every year. ?This information is not intended to replace advice given to you by your health care provider. Make sure you discuss any questions you have with your health care provider. ?Doc

## 2021-11-14 NOTE — Progress Notes (Signed)
?Virtual Visit Consent  ? ?[name], you are scheduled for a virtual visit with a [facility] provider today.   ?  ?Just as with appointments in the office, your consent must be obtained to participate.  Your consent will be active for this visit and any virtual visit you may have with one of our providers in the next 365 days.   ?  ?If you have a MyChart account, a copy of this consent can be sent to you electronically.  All virtual visits are billed to your insurance company just like a traditional visit in the office.   ? ?As this is a virtual visit, video technology does not allow for your provider to perform a traditional examination.  This may limit your provider's ability to fully assess your condition.  If your provider identifies any concerns that need to be evaluated in person or the need to arrange testing (such as labs, EKG, etc.), we will make arrangements to do so.   ?  ?Although advances in technology are sophisticated, we cannot ensure that it will always work on either your end or our end.  If the connection with a video visit is poor, the visit may have to be switched to a telephone visit.  With either a video or telephone visit, we are not always able to ensure that we have a secure connection.    ? ?I need to obtain your verbal consent now.   Are you willing to proceed with your visit today?  ?  ?[name] has provided verbal consent on 11/14/2021 for a virtual visit (video or telephone). ?  ?[name], PA-C  ? ?Date: 11/14/2021 5:56 PM ? ? ?Virtual Visit via Video Note  ? ?Ronnie Bowman, connected with  Ronnie Bowman  ([id number], Mar 28, 1956) on 11/14/21 at  5:45 PM EDT by a video-enabled telemedicine application and verified that I am speaking with the correct person using two identifiers. ? ?Location: ?Patient: Virtual Visit Location Patient: Home ?Provider: Virtual Visit Location Provider: Home Office ?  ?I discussed the limitations of evaluation and management by  telemedicine and the availability of in person appointments. The patient expressed understanding and agreed to proceed.   ? ?History of Present Illness: ?Ronnie Bowman is a 66 y.o. who identifies as a male who was assigned male at birth, and is being seen today for URI symptoms. ? ?HPI: URI  ?This is a new problem. The current episode started in the past 7 days. The problem has been gradually worsening. Maximum temperature: subjective fevers. Associated symptoms include congestion, coughing (productive), ear pain, headaches, a plugged ear sensation, rhinorrhea and sinus pain. He has tried antihistamine, decongestant, increased fluids and inhaler use (Mucinex) for the symptoms. The treatment provided no relief.   ? ? ?Problems:  ?Patient Active Problem List  ? Diagnosis Date Noted  ? Bacterial lobar pneumonia 09/14/2021  ? Acute hyponatremia 09/13/2021  ? Elevated brain natriuretic peptide (BNP) level 09/13/2021  ? COPD with exacerbation (HCC) 08/28/2021  ? Cramping of hands 05/12/2019  ? Subtherapeutic international normalized ratio (INR) 02/17/2019  ? Mild peripheral edema 02/05/2019  ? Financial difficulties 01/02/2019  ? History of medication noncompliance 01/02/2019  ? 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 Hyperalbuminemia 12/27/2018  ? Hypertension goal BP (blood pressure) < 130/80 04/15/2018  ? History of pulmonary embolism 04/15/2018  ? Chronic obstructive pulmonary disease (HCC) 04/15/2018  ? 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 Former smoker 03/02/2011  ?  ?Allergies: No Known  Allergies ?Medications:  ?Current Outpatient Medications:  ?  cefUROXime (CEFTIN) 500 MG tablet, Take 1 tablet (500 mg total) by mouth 2 (two) times daily with a meal for 7 days., Disp: 14 tablet, Rfl: 0 ?  predniSONE (STERAPRED UNI-PAK 21 TAB) 10 MG (21) TBPK tablet, 6 day taper; take as directed on package instructions, Disp: 21 tablet, Rfl: 0 ?  albuterol (VENTOLIN HFA) 108 (90 Base) MCG/ACT inhaler, Inhale 2 puffs into the lungs  every 6 (six) hours as needed for wheezing., Disp: 2 each, Rfl: 11 ?  AMBULATORY NON FORMULARY MEDICATION, Take 2 each by mouth every morning. Medication Name: Goli gummies, Disp: , Rfl:  ?  amitriptyline (ELAVIL) 75 MG tablet, TAKE 1 TO 2 TABLETS BY MOUTH AT BEDTIME AS NEEDED FOR SLEEP, Disp: 60 tablet, Rfl: 0 ?  amLODipine (NORVASC) 10 MG tablet, Take 1 tablet (10 mg total) by mouth daily. (Patient taking differently: Take 5 mg by mouth daily.), Disp: 90 tablet, Rfl: 1 ?  aspirin EC 81 MG tablet, Take 1 tablet (81 mg total) by mouth daily., Disp: 90 tablet, Rfl: 3 ?  atorvastatin (LIPITOR) 20 MG tablet, Take 1 tablet (20 mg total) by mouth at bedtime., Disp: 90 tablet, Rfl: 3 ?  carvedilol (COREG) 25 MG tablet, Take 1 tablet (25 mg total) by mouth 2 (two) times daily with a meal., Disp: 180 tablet, Rfl: 1 ?  fluocinolone (VANOS) 0.01 % cream, Apply topically 3 (three) times daily. To affected area(s) as needed three times per day for redness/irritation lower legs, can use once weekly as maintenance/prevention, Disp: 120 g, Rfl: 1 ?  Fluticasone-Umeclidin-Vilant (TRELEGY ELLIPTA) 100-62.5-25 MCG/ACT AEPB, Inhale 1 puff into the lungs daily., Disp: 1 each, Rfl: 11 ?  furosemide (LASIX) 20 MG tablet, Take 1 tablet (20 mg total) by mouth 2 (two) times daily. Increase to 2 tablets (40 mg) bid for no more than 3 days prn weight gain 5 lbs over 48 hours OR increased lower extremity swelling, Disp: 180 tablet, Rfl: 1 ?  HYDROcodone bit-homatropine (HYDROMET) 5-1.5 MG/5ML syrup, Take 5 mLs by mouth every 6 (six) hours as needed for cough., Disp: 120 mL, Rfl: 0 ?  lisinopril (ZESTRIL) 40 MG tablet, Take 1 tablet by mouth once daily., Disp: 90 tablet, Rfl: 1 ?  Melatonin 10 MG CAPS, Take 10 mg by mouth., Disp: , Rfl:  ?  nitroGLYCERIN (NITROSTAT) 0.4 MG SL tablet, Place 1 tablet (0.4 mg total) under the tongue every 5 (five) minutes as needed for chest pain., Disp: 30 tablet, Rfl: 3 ?  spironolactone (ALDACTONE) 50 MG  tablet, Take 1 tablet by mouth once daily., Disp: 90 tablet, Rfl: 1 ? ?Observations/Objective: ?Patient is well-developed, well-nourished in no acute distress.  ?Resting comfortably at home.  ?Head is normocephalic, atraumatic.  ?No labored breathing.  ?Speech is clear and coherent with logical content.  ?Patient is alert and oriented at baseline.  ? ? ?Assessment and Plan: ?1. COPD with exacerbation (HCC) ?- cefUROXime (CEFTIN) 500 MG tablet; Take 1 tablet (500 mg total) by mouth 2 (two) times daily with a meal for 7 days.  Dispense: 14 tablet; Refill: 0 ?- predniSONE (STERAPRED UNI-PAK 21 TAB) 10 MG (21) TBPK tablet; 6 day taper; take as directed on package instructions  Dispense: 21 tablet; Refill: 0 ? ?2. Acute bacterial sinusitis ?- cefUROXime (CEFTIN) 500 MG tablet; Take 1 tablet (500 mg total) by mouth 2 (two) times daily with a meal for 7 days.  Dispense: 14 tablet; Refill: 0 ? ?-  Suspect possible COPD exacerbation with associated sinusitis ?- Ceftin and prednisone prescribed ?- Advised could continue PLAIN Mucinex (Avoid Mucinex D with blood pressure and Aboid Mucinex DM with productive cough); could change to Mucinex DM if cough becomes dry and hacking ?- Push fluids ?- Steam and humidifier can help ?- Seek in person evaluation if not improving or if symptoms worsen ? ?Follow Up Instructions: ?I discussed the assessment and treatment plan with the patient. The patient was provided an opportunity to ask questions and all were answered. The patient agreed with the plan and demonstrated an understanding of the instructions.  A copy of instructions were sent to the patient via MyChart unless otherwise noted below.  ? ? ?The patient was advised to call back or seek an in-person evaluation if the symptoms worsen or if the condition fails to improve as anticipated. ? ?Time:  ?I spent 12 minutes with the patient via telehealth technology discussing the above problems/concerns.   ? ?Margaretann LovelessJennifer M Mishayla Sliwinski, PA-C ?

## 2021-11-21 ENCOUNTER — Other Ambulatory Visit: Payer: Self-pay | Admitting: Osteopathic Medicine

## 2021-12-05 ENCOUNTER — Ambulatory Visit: Payer: 59 | Admitting: Family Medicine

## 2021-12-16 ENCOUNTER — Other Ambulatory Visit: Payer: Self-pay | Admitting: Family Medicine

## 2022-01-20 ENCOUNTER — Other Ambulatory Visit: Payer: Self-pay | Admitting: Osteopathic Medicine

## 2022-01-20 DIAGNOSIS — R053 Chronic cough: Secondary | ICD-10-CM

## 2022-01-21 IMAGING — DX DG CHEST 2V
2 series · 2 of 2 positions shown · non-contrast
Comparison: 07/09/2020

CLINICAL DATA: Lower extremity edema.

EXAM:
CHEST - 2 VIEW

[chest pa]
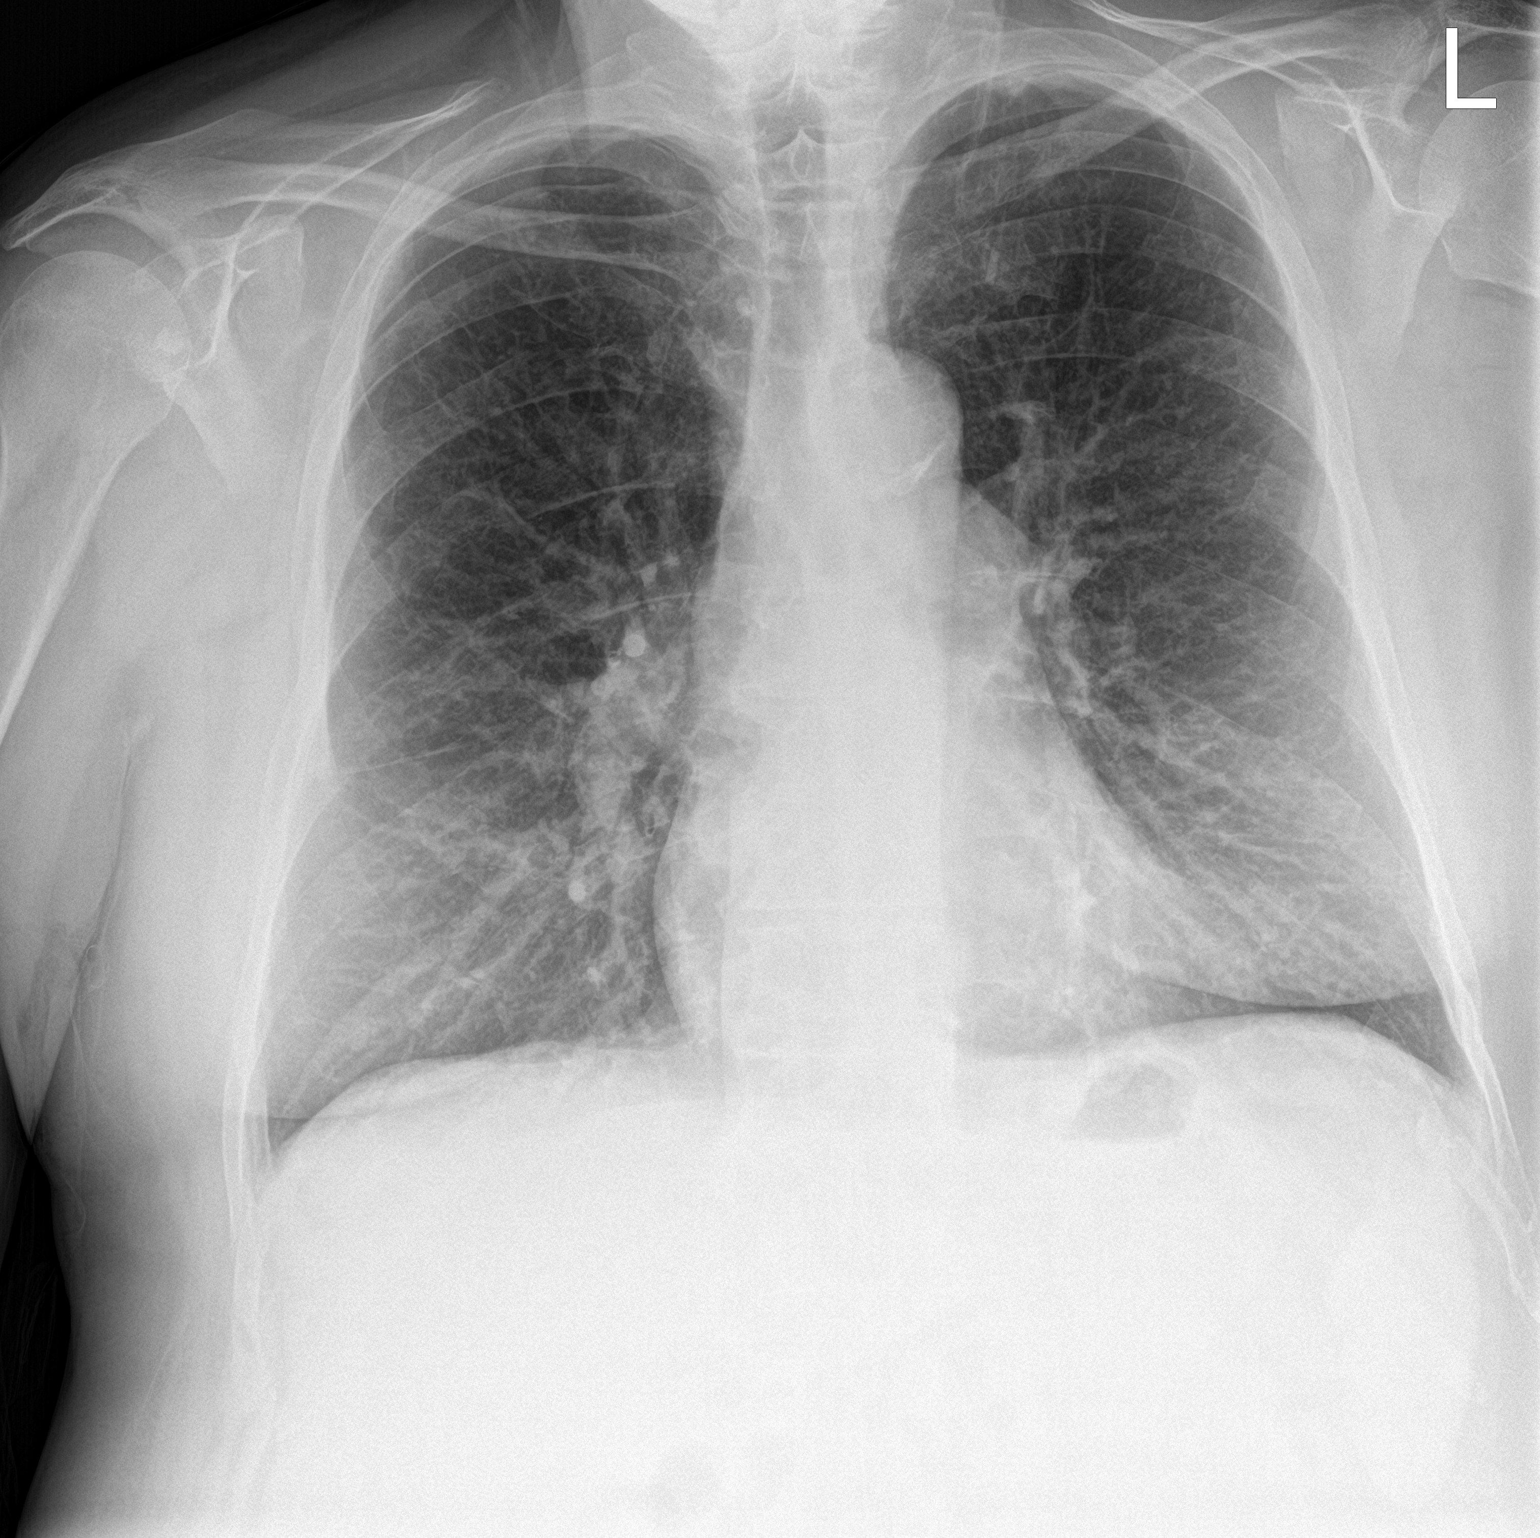

[chest lat]
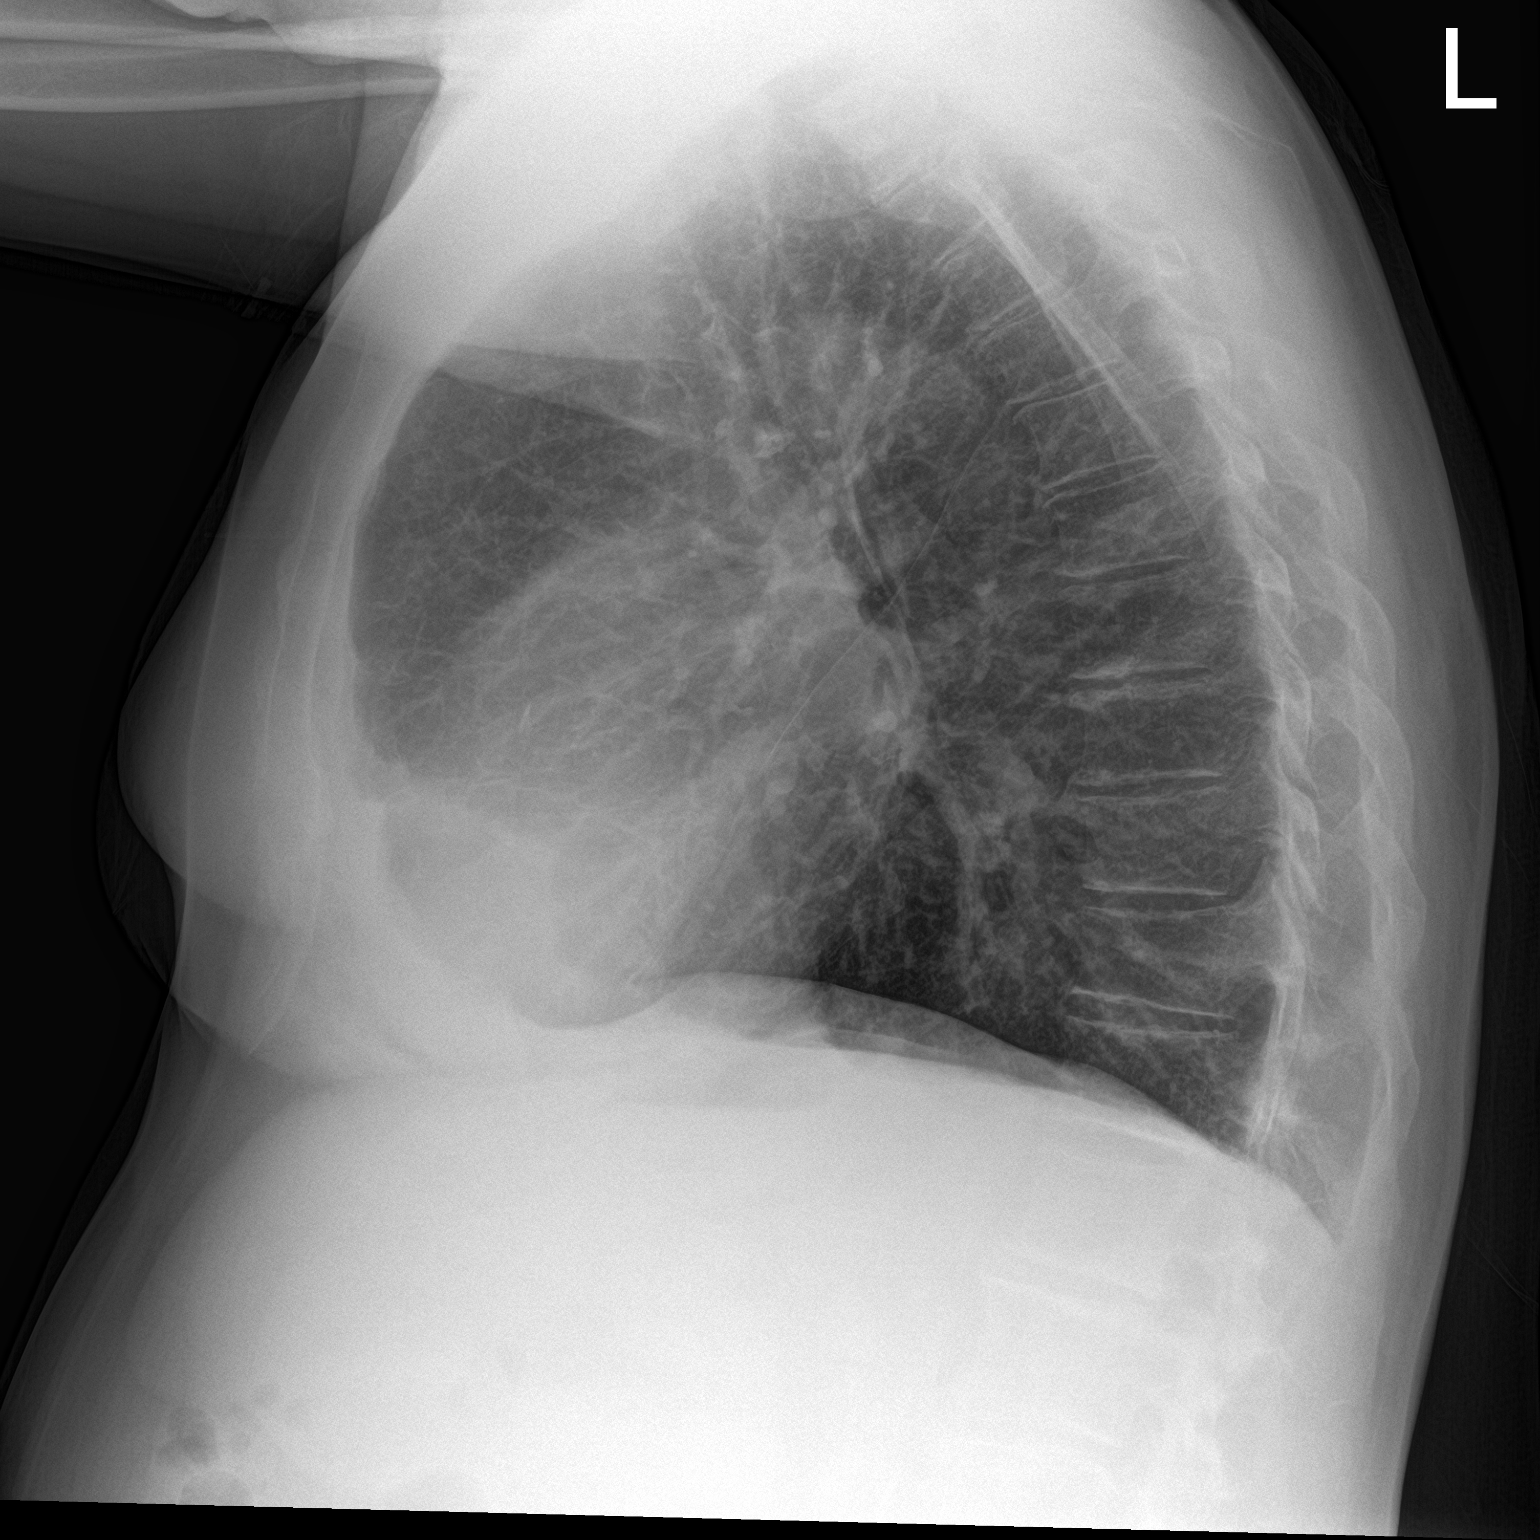

[2 of 2 positions shown; findings below may reference images not displayed]

FINDINGS: Lungs are hyperexpanded. The lungs are clear without focal
pneumonia, edema, pneumothorax or pleural effusion. Interstitial
markings are diffusely coarsened with chronic features. The
cardiopericardial silhouette is within normal limits for size. The
visualized bony structures of the thorax show no acute abnormality.
IMPRESSION: No active cardiopulmonary disease.

## 2022-01-21 NOTE — Telephone Encounter (Signed)
[facility] pharmacy requesting med refill for benzonatate. Rx not listed in active med list.

## 2022-01-22 ENCOUNTER — Other Ambulatory Visit: Payer: Self-pay | Admitting: Family Medicine

## 2022-01-29 ENCOUNTER — Other Ambulatory Visit: Payer: Self-pay | Admitting: Family Medicine

## 2022-02-14 ENCOUNTER — Other Ambulatory Visit: Payer: Self-pay | Admitting: Family Medicine

## 2022-02-18 ENCOUNTER — Other Ambulatory Visit: Payer: Self-pay | Admitting: Family Medicine

## 2022-02-28 ENCOUNTER — Other Ambulatory Visit: Payer: Self-pay | Admitting: Osteopathic Medicine

## 2022-03-05 ENCOUNTER — Encounter: Payer: Self-pay | Admitting: Family Medicine

## 2022-03-07 ENCOUNTER — Other Ambulatory Visit: Payer: Self-pay | Admitting: Family Medicine

## 2022-03-13 ENCOUNTER — Ambulatory Visit
Admission: EM | Admit: 2022-03-13 | Discharge: 2022-03-13 | Disposition: A | Discharge status: Home / Self Care | Payer: 59 | Attending: Family Medicine | Admitting: Family Medicine

## 2022-03-13 ENCOUNTER — Other Ambulatory Visit: Payer: Self-pay

## 2022-03-13 ENCOUNTER — Encounter: Payer: Self-pay | Admitting: Emergency Medicine

## 2022-03-13 DIAGNOSIS — M10072 Idiopathic gout, left ankle and foot: Secondary | ICD-10-CM | POA: Diagnosis not present

## 2022-03-13 DIAGNOSIS — J441 Chronic obstructive pulmonary disease with (acute) exacerbation: Secondary | ICD-10-CM

## 2022-03-13 MED ORDER — COLCHICINE 0.6 MG PO TABS: ORAL_TABLET | ORAL | 0 refills | Status: DC

## 2022-03-13 NOTE — ED Provider Notes (Signed)
[facility]    CSN: [id number] Arrival date & time: 03/13/22  0803      History   Chief Complaint Chief Complaint  Patient presents with   Foot Pain    HPI Ronnie Bowman is a 66 y.o. male.   HPI Very pleasant 66 year old male presents with left foot pain for 5 days denies injury reports sudden onset and believes that this is a an acute gout flare.  Patient presents today in triage with pulse ox of 75% and reports this is normal/baseline for him.  Upon questioning patient is supposed to be on continuous oxygen but refuses to do so.  PMH significant for acute pulmonary embolism, CHF, COPD, former smoker, and HTN.  Patient declined going to ED for extremely low pulse ox prior to my examination today.  Past Medical History:  Diagnosis Date   Acute pulmonary embolism (HCC)    CHF (congestive heart failure) (HCC)    Community acquired pneumonia of left lung 04/15/2018   COPD (chronic obstructive pulmonary disease) (HCC)    Hypertension     Patient Active Problem List   Diagnosis Date Noted   Bacterial lobar pneumonia 09/14/2021   Acute hyponatremia 09/13/2021   Elevated brain natriuretic peptide (BNP) level 09/13/2021   COPD with exacerbation (HCC) 08/28/2021   Cramping of hands 05/12/2019   Subtherapeutic international normalized ratio (INR) 02/17/2019   Mild peripheral edema 02/05/2019   Financial difficulties 01/02/2019   History of medication noncompliance 01/02/2019   Elevated serum globulin level 12/27/2018   Hyperalbuminemia 12/27/2018   Hypertension goal BP (blood pressure) < 130/80 04/15/2018   History of pulmonary embolism 04/15/2018   Chronic obstructive pulmonary disease (HCC) 04/15/2018   Primary insomnia 04/15/2018   Former smoker 03/02/2011    History reviewed. No pertinent surgical history.     Home Medications    Prior to Admission medications   Medication Sig Start Date End Date Taking? Authorizing Provider  colchicine 0.6 MG tablet  Take 2 tabs p.o. now, may repeat 1 tab in 1 to 2 hours if pain is still present.  Do not exceed more than 3 tablets in 24 hours. 03/13/22  Yes Trevor Iha, FNP  albuterol (VENTOLIN HFA) 108 (90 Base) MCG/ACT inhaler INHALE 2 PUFFS BY MOUTH EVERY 6 HOURS AS NEEDED FOR WHEEZING 03/09/22   Everrett Coombe, DO  AMBULATORY NON FORMULARY MEDICATION Take 2 each by mouth every morning. Medication Name: [place] gummies    [provider]  amitriptyline (ELAVIL) 75 MG tablet TAKE 1 TO 2 TABLETS BY MOUTH AT BEDTIME AS NEEDED FOR SLEEP 02/18/22   Everrett Coombe, DO  amLODipine (NORVASC) 10 MG tablet Take 1 tablet by mouth once daily 03/02/22   Everrett Coombe, DO  aspirin EC 81 MG tablet Take 1 tablet (81 mg total) by mouth daily. 03/14/21   Sunnie Nielsen, DO  atorvastatin (LIPITOR) 20 MG tablet Take 1 tablet (20 mg total) by mouth at bedtime. 03/14/21   Sunnie Nielsen, DO  benzonatate (TESSALON) 200 MG capsule Take 1 capsule by mouth three times daily as needed for cough 01/21/22   Everrett Coombe, DO  carvedilol (COREG) 25 MG tablet Take 1 tablet (25 mg total) by mouth 2 (two) times daily with a meal. 09/26/21   Everrett Coombe, DO  fluocinolone (VANOS) 0.01 % cream Apply topically 3 (three) times daily. To affected area(s) as needed three times per day for redness/irritation lower legs, can use once weekly as maintenance/prevention 04/21/21   Sunnie Nielsen, DO  Fluticasone-Umeclidin-Vilant (TRELEGY ELLIPTA) 100-62.5-25 MCG/ACT AEPB Inhale 1 puff into the lungs daily. 09/05/21   Everrett Coombe, DO  furosemide (LASIX) 20 MG tablet Take 1 tablet (20 mg total) by mouth 2 (two) times daily. Increase to 2 tablets (40 mg) bid for no more than 3 days prn weight gain 5 lbs over 48 hours OR increased lower extremity swelling 09/26/21   Everrett Coombe, DO  lisinopril (ZESTRIL) 40 MG tablet Take 1 tablet by mouth once daily. 09/26/21   Everrett Coombe, DO  nitroGLYCERIN (NITROSTAT) 0.4 MG SL tablet Place 1 tablet (0.4  mg total) under the tongue every 5 (five) minutes as needed for chest pain. 03/14/21   Sunnie Nielsen, DO  predniSONE (STERAPRED UNI-PAK 21 TAB) 10 MG (21) TBPK tablet 6 day taper; take as directed on package instructions 11/14/21   [name], PA-C  spironolactone (ALDACTONE) 50 MG tablet Take 1 tablet by mouth once daily. 09/26/21   Everrett Coombe, DO    Family History Family History  Problem Relation Age of Onset   Leukemia Mother    Heart failure Father     Social History Social History   Tobacco Use   Smoking status: Former    Packs/day: 2.00    Years: 45.00    Total pack years: 90.00    Types: Cigarettes    Quit date: 11/01/2017    Years since quitting: 4.3   Smokeless tobacco: Never  Vaping Use   Vaping Use: Former  Substance Use Topics   Alcohol use: Yes    Alcohol/week: 3.0 standard drinks of alcohol    Types: 3 Standard drinks or equivalent per week    Comment: 5th a week   Drug use: Never     Allergies   Patient has no known allergies.   Review of Systems Review of Systems  Musculoskeletal:        Foot pain x 5 days     Physical Exam Triage Vital Signs ED Triage Vitals  Enc Vitals Group     BP 03/13/22 0837 116/74     Pulse Rate 03/13/22 0837 64     Resp 03/13/22 0837 18     Temp 03/13/22 0837 98 F (36.7 C)     Temp Source 03/13/22 0837 Oral     SpO2 03/13/22 0837 (!) 75 %     Weight 03/13/22 0839 200 lb (90.7 kg)     Height 03/13/22 0839 5\' 9"  (1.753 m)     Head Circumference --      Peak Flow --      Pain Score 03/13/22 0838 2     Pain Loc --      Pain Edu? --      Excl. in GC? --    No data found.  Updated Vital Signs BP 116/74 (BP Location: Left Arm)   Pulse 64   Temp 98 F (36.7 C) (Oral)   Resp 20   Ht 5\' 9"  (1.753 m)   Wt 200 lb (90.7 kg)   SpO2 95%   BMI 29.53 kg/m    Physical Exam Vitals and nursing note reviewed.  Constitutional:      Appearance: Normal appearance. He is normal weight. He is  ill-appearing.  HENT:     Head: Normocephalic and atraumatic.     Mouth/Throat:     Mouth: Mucous membranes are moist.     Pharynx: Oropharynx is clear.  Eyes:     Extraocular Movements: Extraocular movements intact.  Conjunctiva/sclera: Conjunctivae normal.     Pupils: Pupils are equal, round, and reactive to light.  Neck:     Comments: No JVD, no bruit Cardiovascular:     Rate and Rhythm: Normal rate and regular rhythm.     Pulses: Normal pulses.     Heart sounds: Normal heart sounds.  Pulmonary:     Effort: Pulmonary effort is normal.     Breath sounds: Wheezing, rhonchi and rales present.     Comments: Diminished breath sounds noted throughout, broken glass-crackles noted bibasilarly Musculoskeletal:     Cervical back: Normal range of motion and neck supple.     Comments: Left foot (dorsum over first MTP): TTP erythematous, with moderate soft tissue swelling noted-please see image below  Skin:    General: Skin is warm and dry.     Findings: Erythema present.  Neurological:     General: No focal deficit present.     Mental Status: He is alert and oriented to person, place, and time.       UC Treatments / Results  Labs (all labs ordered are listed, but only abnormal results are displayed) Labs Reviewed - No data to display  EKG   Radiology No results found.  Procedures Procedures (including critical care time)  Medications Ordered in UC Medications - No data to display  Initial Impression / Assessment and Plan / UC Course  I have reviewed the triage vital signs and the nursing notes.  Pertinent labs & imaging results that were available during my care of the patient were reviewed by me and considered in my medical decision making (see chart for details).     MDM: 1.  Acute idiopathic gout of left foot-Rx'd colchicine; 2.  COPD exacerbation initial pulse ox was 75% after being on right ear for 8-10 minutes pulse ox achieved 95% prior to discharge-AMA form  signed by patient prior to discharge with copy provided to patient. Advised patient to go to nearest ED [facility]) now for further evaluation of extremely low pulse ox.  Instructed patient to take medication as directed with food.  Encouraged patient to increase daily water intake while taking this medication.  Advised patient if symptoms worsen and or unresolved please go to nearest ED for further evaluation.  Patient discharged home, hemodynamically stable. Final Clinical Impressions(s) / UC Diagnoses   Final diagnoses:  Acute idiopathic gout of left foot  COPD exacerbation [facility])     Discharge Instructions      Advised patient to go to nearest ED [facility]) now for further evaluation of extremely low pulse ox.  Instructed patient to take medication as directed with food.  Encouraged patient to increase daily water intake while taking this medication.  Advised patient if symptoms worsen and or unresolved please go to nearest ED for further evaluation.     ED Prescriptions     Medication Sig Dispense Auth. Provider   colchicine 0.6 MG tablet Take 2 tabs p.o. now, may repeat 1 tab in 1 to 2 hours if pain is still present.  Do not exceed more than 3 tablets in 24 hours. 30 tablet Eliezer Lofts, FNP      PDMP not reviewed this encounter.   Eliezer Lofts, [place] 03/13/22 [phone]

## 2022-03-13 NOTE — Discharge Instructions (Addendum)
Advised patient to go to nearest ED [facility]) now for further evaluation of extremely low pulse ox.  Instructed patient to take medication as directed with food.  Encouraged patient to increase daily water intake while taking this medication.  Advised patient if symptoms worsen and or unresolved please go to nearest ED for further evaluation.

## 2022-03-13 NOTE — ED Triage Notes (Signed)
Left foot pain, denies injury. X 5 days, sudden onset.

## 2022-03-13 NOTE — ED Notes (Signed)
This RN witnessed AMA form - pt stated his O2 sat level is always low. Refusing higher level of care at this time. Pt given a hard copy od AMA form

## 2022-03-14 ENCOUNTER — Telehealth: Payer: Self-pay

## 2022-03-14 NOTE — Telephone Encounter (Signed)
Follow up call  Pt states that he is feeling better.  Pt states that he does still have some swelling.   Pt was instructed to give office a call if anything changes.  Pt states that he still does have some swelling put was instructed to keep foot elevated.

## 2022-03-15 ENCOUNTER — Other Ambulatory Visit: Payer: Self-pay | Admitting: Family Medicine

## 2022-03-19 ENCOUNTER — Other Ambulatory Visit: Payer: Self-pay | Admitting: Osteopathic Medicine

## 2022-03-23 ENCOUNTER — Other Ambulatory Visit: Payer: Self-pay | Admitting: Family Medicine

## 2022-03-23 DIAGNOSIS — I10 Essential (primary) hypertension: Secondary | ICD-10-CM

## 2022-03-25 ENCOUNTER — Other Ambulatory Visit: Payer: Self-pay | Admitting: Family Medicine

## 2022-03-27 ENCOUNTER — Encounter: Payer: Self-pay | Admitting: Family Medicine

## 2022-03-27 ENCOUNTER — Ambulatory Visit (INDEPENDENT_AMBULATORY_CARE_PROVIDER_SITE_OTHER): Payer: 59 | Admitting: Family Medicine

## 2022-03-27 DIAGNOSIS — J9611 Chronic respiratory failure with hypoxia: Secondary | ICD-10-CM

## 2022-03-27 DIAGNOSIS — I10 Essential (primary) hypertension: Secondary | ICD-10-CM | POA: Diagnosis not present

## 2022-03-27 DIAGNOSIS — J9610 Chronic respiratory failure, unspecified whether with hypoxia or hypercapnia: Secondary | ICD-10-CM | POA: Insufficient documentation

## 2022-03-27 MED ORDER — AMLODIPINE BESYLATE 2.5 MG PO TABS: 2.5000 mg | ORAL_TABLET | Freq: Every day | ORAL | 2 refills | Status: DC

## 2022-03-27 MED ORDER — ATORVASTATIN CALCIUM 20 MG PO TABS: 20.0000 mg | ORAL_TABLET | Freq: Every day | ORAL | 3 refills | Status: AC

## 2022-03-27 NOTE — Assessment & Plan Note (Addendum)
BP has been a little low recently.  Decreasing amlodipine to 2.5mg .  Monitor BP at home. Follow up in 2 months.

## 2022-03-27 NOTE — Patient Instructions (Signed)
Decrease amlodipine to 2.5mg .  Updated prescription sent in.  See me again in 8 weeks.   Call if having new/worsening symptoms.

## 2022-03-27 NOTE — Progress Notes (Signed)
Ronnie Bowman - 66 y.o. male MRN [id number]  Date of birth: 04/03/1956  Subjective Chief Complaint  Patient presents with   Hypotension    HPI Ronnie Bowman is a 66 y.o. male here today with complaint of lightheadedness and dizziness.  He has had this off and on for several weeks.  He has tried reducing amlodipine to 1/2 tab but still has symptoms.  He did try stopping completely however BP was increased.  He is taking lisinopril and carvedilol.  Denies chest pain, shortness of breath, palpitations, nausea or vomiting.   O2 sats remain decreased. O2 has been recommended for him in the past however he declines.    ROS:  A comprehensive ROS was completed and negative except as noted per HPI  No Known Allergies  Past Medical History:  Diagnosis Date   Acute pulmonary embolism (HCC)    CHF (congestive heart failure) (HCC)    Community acquired pneumonia of left lung 04/15/2018   COPD (chronic obstructive pulmonary disease) (HCC)    Hypertension     History reviewed. No pertinent surgical history.  Social History   Socioeconomic History   Marital status: Single    Spouse name: Not on file   Number of children: Not on file   Years of education: Not on file   Highest education level: Not on file  Occupational History   Not on file  Tobacco Use   Smoking status: Former    Packs/day: 2.00    Years: 45.00    Total pack years: 90.00    Types: Cigarettes    Quit date: 11/01/2017    Years since quitting: 4.4   Smokeless tobacco: Never  Vaping Use   Vaping Use: Former  Substance and Sexual Activity   Alcohol use: Yes    Alcohol/week: 3.0 standard drinks of alcohol    Types: 3 Standard drinks or equivalent per week    Comment: 5th a week   Drug use: Never   Sexual activity: Yes    Birth control/protection: None  Other Topics Concern   Not on file  Social History Narrative   Not on file   Social Determinants of Health   Financial Resource Strain: Not on file  Food  Insecurity: Not on file  Transportation Needs: Not on file  Physical Activity: Not on file  Stress: Not on file  Social Connections: Not on file    Family History  Problem Relation Age of Onset   Leukemia Mother    Heart failure Father     Health Maintenance  Topic Date Due   TETANUS/TDAP  09/05/2022 (Originally 12/25/2019)   INFLUENZA VACCINE  11/01/2022 (Originally 03/03/2022)   Zoster Vaccines- Shingrix (1 of 2) 12/04/2022 (Originally 06/14/2008)   COLONOSCOPY (Pts 45-35yrs Insurance coverage will need to be confirmed)  03/28/2023 (Originally 06/15/2003)   Hepatitis C Screening  Completed   HIV Screening  Completed   HPV VACCINES  Aged Out   COVID-19 Vaccine  Discontinued     ----------------------------------------------------------------------------------------------------------------------------------------------------------------------------------------------------------------- Physical Exam Ht 5\' 9"  (1.753 m)   Wt 206 lb (93.4 kg)   SpO2 90%   BMI 30.42 kg/m   Physical Exam Constitutional:      Appearance: Normal appearance.  Eyes:     General: No scleral icterus. Cardiovascular:     Rate and Rhythm: Normal rate and regular rhythm.  Pulmonary:     Effort: Pulmonary effort is normal.     Breath sounds: Normal breath sounds.  Neurological:     Mental  Status: He is alert.  Psychiatric:        Mood and Affect: Mood normal.        Behavior: Behavior normal.     ------------------------------------------------------------------------------------------------------------------------------------------------------------------------------------------------------------------- Assessment and Plan  Hypertension goal BP (blood pressure) < 130/80 BP has been a little low recently.  Decreasing amlodipine to 2.5mg .  Monitor BP at home. Follow up in 2 months.   Chronic respiratory failure (HCC) He has quit smoking.  O2 sats remain low.  Refuses O2.  He is aware of risks  of decreased O2 levels including heart attack, stroke, and limb ischemia.    Meds ordered this encounter  Medications   amLODipine (NORVASC) 2.5 MG tablet    Sig: Take 1 tablet (2.5 mg total) by mouth daily.    Dispense:  90 tablet    Refill:  2   atorvastatin (LIPITOR) 20 MG tablet    Sig: Take 1 tablet (20 mg total) by mouth at bedtime.    Dispense:  90 tablet    Refill:  3    No follow-ups on file.    This visit occurred during the SARS-CoV-2 public health emergency.  Safety protocols were in place, including screening questions prior to the visit, additional usage of staff PPE, and extensive cleaning of exam room while observing appropriate contact time as indicated for disinfecting solutions.

## 2022-03-27 NOTE — Assessment & Plan Note (Addendum)
He has quit smoking.  O2 sats remain low.  Refuses O2.  He is aware of risks of decreased O2 levels including heart attack, stroke, and limb ischemia.

## 2022-03-30 ENCOUNTER — Other Ambulatory Visit: Payer: Self-pay | Admitting: Family Medicine

## 2022-04-02 ENCOUNTER — Other Ambulatory Visit: Payer: Self-pay | Admitting: Family Medicine

## 2022-04-02 DIAGNOSIS — R053 Chronic cough: Secondary | ICD-10-CM

## 2022-04-05 ENCOUNTER — Other Ambulatory Visit: Payer: Self-pay | Admitting: Family Medicine

## 2022-04-05 DIAGNOSIS — I10 Essential (primary) hypertension: Secondary | ICD-10-CM

## 2022-04-09 ENCOUNTER — Other Ambulatory Visit: Payer: Self-pay | Admitting: Family Medicine

## 2022-04-20 ENCOUNTER — Other Ambulatory Visit: Payer: Self-pay

## 2022-04-20 DIAGNOSIS — R053 Chronic cough: Secondary | ICD-10-CM

## 2022-04-20 MED ORDER — BENZONATATE 200 MG PO CAPS: ORAL_CAPSULE | ORAL | 0 refills | Status: DC

## 2022-04-20 MED ORDER — FLUOCINOLONE ACETONIDE 0.01 % EX CREA: TOPICAL_CREAM | Freq: Three times a day (TID) | CUTANEOUS | 1 refills | Status: DC

## 2022-04-20 MED ORDER — FUROSEMIDE 20 MG PO TABS
20.0000 mg | ORAL_TABLET | Freq: Two times a day (BID) | ORAL | 1 refills | Status: AC
Start: 2022-04-20 — End: ?

## 2022-04-25 ENCOUNTER — Other Ambulatory Visit: Payer: Self-pay | Admitting: Family Medicine

## 2022-04-29 ENCOUNTER — Other Ambulatory Visit: Payer: Self-pay

## 2022-04-29 MED ORDER — FLUOCINOLONE ACETONIDE 0.01 % EX CREA: TOPICAL_CREAM | Freq: Three times a day (TID) | CUTANEOUS | 1 refills | Status: DC

## 2022-05-07 ENCOUNTER — Other Ambulatory Visit: Payer: Self-pay | Admitting: Family Medicine

## 2022-05-16 ENCOUNTER — Other Ambulatory Visit: Payer: Self-pay | Admitting: Family Medicine

## 2022-05-29 ENCOUNTER — Encounter: Payer: Self-pay | Admitting: Family Medicine

## 2022-05-29 ENCOUNTER — Ambulatory Visit (INDEPENDENT_AMBULATORY_CARE_PROVIDER_SITE_OTHER): Payer: 59 | Admitting: Family Medicine

## 2022-05-29 VITALS — BP 117/65 | HR 70 | Temp 97.8°F | Ht 69.0 in | Wt 198.0 lb

## 2022-05-29 DIAGNOSIS — J449 Chronic obstructive pulmonary disease, unspecified: Secondary | ICD-10-CM

## 2022-05-29 DIAGNOSIS — J441 Chronic obstructive pulmonary disease with (acute) exacerbation: Secondary | ICD-10-CM | POA: Diagnosis not present

## 2022-05-29 DIAGNOSIS — K429 Umbilical hernia without obstruction or gangrene: Secondary | ICD-10-CM | POA: Diagnosis not present

## 2022-05-29 DIAGNOSIS — I10 Essential (primary) hypertension: Secondary | ICD-10-CM | POA: Diagnosis not present

## 2022-05-29 MED ORDER — MOMETASONE FURO-FORMOTEROL FUM 100-5 MCG/ACT IN AERO: 2.0000 | INHALATION_SPRAY | Freq: Two times a day (BID) | RESPIRATORY_TRACT | 3 refills | Status: DC

## 2022-05-29 MED ORDER — CLOBETASOL PROPIONATE 0.05 % EX CREA: 1.0000 | TOPICAL_CREAM | Freq: Two times a day (BID) | CUTANEOUS | 0 refills | Status: DC

## 2022-05-29 MED ORDER — PREDNISONE 50 MG PO TABS: ORAL_TABLET | ORAL | 0 refills | Status: DC

## 2022-05-29 MED ORDER — DOXYCYCLINE HYCLATE 100 MG PO TABS: 100.0000 mg | ORAL_TABLET | Freq: Two times a day (BID) | ORAL | 0 refills | Status: DC

## 2022-05-29 NOTE — Patient Instructions (Addendum)
Start prednisone and doxycycline.    Start Dulera daily for COPD maintenance.   Chronic Obstructive Pulmonary Disease Exacerbation  Chronic obstructive pulmonary disease (COPD) is a long-term (chronic) lung problem. In COPD, the flow of air from the lungs is limited. COPD exacerbations are times that breathing gets worse and you need more than your normal treatment. Without treatment, they can be life-threatening. If they happen often, your lungs can become more damaged. What are the causes? Having infections that affect your airways and lungs. Being exposed to: Smoke. Air pollution. Chemical fumes. Dust. Things that can cause an allergic reaction (allergens). Not taking your usual COPD medicines as told. Having medical problems already, such as heart failure or infections not involving the lungs. In many cases, the cause is not known. What increases the risk? Smoking. Being an older adult. Having frequent prior COPD exacerbations. What are the signs or symptoms? Increased coughing. Increased mucus from your lungs. Increased wheezing. Increased shortness of breath. Fast breathing and finding it hard to breathe. Chest tightness. Less energy than usual. Sleep disruption from symptoms. Confusion. Increased sleepiness. Often, these symptoms happen or get worse even with the use of medicines. How is this treated? Treatment for this condition depends on how bad it is and the cause of the symptoms. You may need to stay in the hospital for treatment. Treatment may include: Taking medicines. Using oxygen. Being treated with different ways to clear your airway, such as using a mask to deliver oxygen. Follow these instructions at home: Medicines Take over-the-counter and prescription medicines only as told by your doctor. Use all inhaled medicines the correct way. If you were prescribed an antibiotic or steroid medicine, take it as told by your doctor. Do not stop taking it even if  you start to feel better. Lifestyle Do not smoke or use any products that contain nicotine or tobacco. If you need help quitting, ask your doctor. Eat healthy foods. Exercise regularly. Get enough sleep. Most adults need 7 or more hours per night. Avoid tobacco smoke and other things that can bother your lungs. Several times a day, wash your hands with soap and water for at least 20 seconds. If you cannot use soap and water, use hand sanitizer. This may help keep you from getting an infection. During flu season, avoid areas that are crowded with people. General instructions Drink enough fluid to keep your pee (urine) pale yellow. Do not do this if your doctor has told you not to. Use a cool mist machine (vaporizer). If you use oxygen or a machine that turns medicine into a mist (nebulizer), continue to use it as told. Keep all follow-up visits. How is this prevented? Keep up with shots (vaccinations) as told by your doctor. Be sure to get a yearly flu (influenza) shot. If you smoke, quit smoking. Smoking makes the problem worse. Follow all instructions for rehabilitation. These are steps you can take to make your body work better. Work with your doctor to develop and follow an action plan. This tells you what steps to take when you experience certain symptoms. Contact a doctor if: Your COPD symptoms get worse than normal. Get help right away if: You are short of breath and it gets worse, even when you are resting. You have trouble talking. You have chest pain. You cough up blood. You have a fever. You keep vomiting. You feel weak or you pass out (faint). You feel confused. You are not able to sleep because of your symptoms. You  have trouble doing daily activities. These symptoms may be an emergency. Get help right away. Call your local emergency services (911 in the [place] Do not wait to see if the symptoms will go away. Do not drive yourself to the hospital. Summary COPD  exacerbations are times that breathing gets worse and you need more treatment than normal. COPD exacerbations can be very serious and may cause your lungs to become more damaged. Do not smoke. If you need help quitting, ask your doctor. Stay up to date on your shots. Get a flu shot every year. This information is not intended to replace advice given to you by your health care provider. Make sure you discuss any questions you have with your health care provider. Document Revised: 06/12/2020 Document Reviewed: 05/28/2020 Elsevier Patient Education  2023 [employer].

## 2022-05-29 NOTE — Assessment & Plan Note (Signed)
BP is well controlled with current medications.  Recommend continuation.   

## 2022-05-29 NOTE — Assessment & Plan Note (Addendum)
Adding dulera BID for maintenance.  Continue albuterol as needed.  Has home O2 at home.  Discussed risks of chronic hypoxemia.

## 2022-05-29 NOTE — Progress Notes (Signed)
Ronnie Bowman - 66 y.o. male MRN [id number]  Date of birth: 22-Feb-1956  Subjective Chief Complaint  Patient presents with   URI    HPI Ronnie Bowman is a 66 y.o. male here today for follow up visit.   He reports having increased respiratory symptoms with more wheezing and increased sputum production.  Feels like he has had fever a couple nights this week.  He is using albuterol more often.  O2 sats in the upper 70's with ambulation. 92% at rest.  He has home O2 but refuses to use this.   He is only using albuterol as needed for COPD.  Has used spriva in the past but doesn't like this.  He would be willing to try something else as long as it is not a dry powder. He is not smoking, but his girlfriend still smokes heavily.    BP has remained well controlled with combination of amlodipine, coreg and lisinopril.  BP is well controlled with current medications.  Denies chest pain, shortness of breath, palpitations, headache or vision changes.   ROS:  A comprehensive ROS was completed and negative except as noted per HPI  No Known Allergies  Past Medical History:  Diagnosis Date   Acute pulmonary embolism (HCC)    CHF (congestive heart failure) ([place])    Community acquired pneumonia of left lung 04/15/2018   COPD (chronic obstructive pulmonary disease) ([place])    Hypertension     History reviewed. No pertinent surgical history.  Social History   Socioeconomic History   Marital status: Single    Spouse name: Not on file   Number of children: Not on file   Years of education: Not on file   Highest education level: Not on file  Occupational History   Not on file  Tobacco Use   Smoking status: Former    Packs/day: 2.00    Years: 45.00    Total pack years: 90.00    Types: Cigarettes    Quit date: 11/01/2017    Years since quitting: 4.5   Smokeless tobacco: Never  Vaping Use   Vaping Use: Former  Substance and Sexual Activity   Alcohol use: Yes    Alcohol/week: 3.0 standard drinks  of alcohol    Types: 3 Standard drinks or equivalent per week    Comment: 5th a week   Drug use: Never   Sexual activity: Yes    Birth control/protection: None  Other Topics Concern   Not on file  Social History Narrative   Not on file   Social Determinants of Health   Financial Resource Strain: Not on file  Food Insecurity: Not on file  Transportation Needs: Not on file  Physical Activity: Not on file  Stress: Not on file  Social Connections: Not on file    Family History  Problem Relation Age of Onset   Leukemia Mother    Heart failure Father     Health Maintenance  Topic Date Due   Lung Cancer Screening  09/03/2022 (Originally 06/14/2008)   TETANUS/TDAP  09/05/2022 (Originally 12/25/2019)   INFLUENZA VACCINE  11/01/2022 (Originally 03/03/2022)   Zoster Vaccines- Shingrix (1 of 2) 12/04/2022 (Originally 06/14/2008)   COLONOSCOPY (Pts 45-33yrs Insurance coverage will need to be confirmed)  03/28/2023 (Originally 06/15/2003)   Hepatitis C Screening  Completed   HIV Screening  Completed   HPV VACCINES  Aged Out   COVID-19 Vaccine  Discontinued     ----------------------------------------------------------------------------------------------------------------------------------------------------------------------------------------------------------------- Physical Exam BP 117/65 (BP Location: Left Arm, Patient  Position: Sitting, Cuff Size: Normal)   Pulse 70   Temp 97.8 F (36.6 C) (Oral)   Ht 5\' 9"  (1.753 m)   Wt 198 lb (89.8 kg)   SpO2 (!) 84%   BMI 29.24 kg/m   Physical Exam Constitutional:      Appearance: Normal appearance.  Eyes:     General: No scleral icterus. Cardiovascular:     Rate and Rhythm: Normal rate and regular rhythm.  Pulmonary:     Effort: Pulmonary effort is normal.     Breath sounds: Wheezing present.  Musculoskeletal:     Cervical back: Neck supple.  Neurological:     Mental Status: He is alert.  Psychiatric:        Mood and Affect:  Mood normal.        Behavior: Behavior normal.     ------------------------------------------------------------------------------------------------------------------------------------------------------------------------------------------------------------------- Assessment and Plan  Hypertension goal BP (blood pressure) < 130/80 BP is well controlled with current medications.  Recommend continuation.    COPD with exacerbation ([place]) Increased wheezing and sputum production.  Adding prednisone burst 50mg  daily x5 days and doxycycline x10 days.   Chronic obstructive pulmonary disease (HCC) Adding dulera BID for maintenance.  Continue albuterol as needed.  Has home O2 at home.  Discussed risks of chronic hypoxemia.     Meds ordered this encounter  Medications   doxycycline (VIBRA-TABS) 100 MG tablet    Sig: Take 1 tablet (100 mg total) by mouth 2 (two) times daily.    Dispense:  20 tablet    Refill:  0   predniSONE (DELTASONE) 50 MG tablet    Sig: Take 1 tab po daily x5 days.    Dispense:  5 tablet    Refill:  0   mometasone-formoterol (DULERA) 100-5 MCG/ACT AERO    Sig: Inhale 2 puffs into the lungs 2 (two) times daily.    Dispense:  13 g    Refill:  3   clobetasol cream (TEMOVATE) 0.05 %    Sig: Apply 1 Application topically 2 (two) times daily.    Dispense:  30 g    Refill:  0    No follow-ups on file.    This visit occurred during the SARS-CoV-2 public health emergency.  Safety protocols were in place, including screening questions prior to the visit, additional usage of staff PPE, and extensive cleaning of exam room while observing appropriate contact time as indicated for disinfecting solutions.

## 2022-05-29 NOTE — Assessment & Plan Note (Signed)
Increased wheezing and sputum production.  Adding prednisone burst 50mg  daily x5 days and doxycycline x10 days.

## 2022-06-04 ENCOUNTER — Other Ambulatory Visit: Payer: Self-pay | Admitting: Family Medicine

## 2022-06-19 ENCOUNTER — Other Ambulatory Visit: Payer: Self-pay | Admitting: Family Medicine

## 2022-06-20 ENCOUNTER — Other Ambulatory Visit: Payer: Self-pay | Admitting: Family Medicine

## 2022-06-20 DIAGNOSIS — I10 Essential (primary) hypertension: Secondary | ICD-10-CM

## 2022-07-02 ENCOUNTER — Telehealth: Payer: Self-pay

## 2022-07-02 ENCOUNTER — Other Ambulatory Visit: Payer: Self-pay | Admitting: Family Medicine

## 2022-07-02 DIAGNOSIS — R053 Chronic cough: Secondary | ICD-10-CM

## 2022-07-02 MED ORDER — BENZONATATE 200 MG PO CAPS: ORAL_CAPSULE | ORAL | 0 refills | Status: AC

## 2022-07-02 MED ORDER — COLCHICINE 0.6 MG PO TABS: ORAL_TABLET | ORAL | 0 refills | Status: DC

## 2022-07-02 NOTE — Addendum Note (Signed)
Addended by: Ardyth Man on: 07/02/2022 11:24 AM   Modules accepted: Orders

## 2022-07-02 NOTE — Telephone Encounter (Signed)
Patient called to schedule an appointment is an is requesting refill on his colchicine for his GOUT, and his also a refill on his benzonatate, please advise, patient is scheduled for 08/21/22!!

## 2022-07-02 NOTE — Telephone Encounter (Signed)
Med refills have been sent per pt request.

## 2022-07-05 ENCOUNTER — Other Ambulatory Visit: Payer: Self-pay | Admitting: Family Medicine

## 2022-07-05 DIAGNOSIS — I10 Essential (primary) hypertension: Secondary | ICD-10-CM

## 2022-07-29 DIAGNOSIS — R296 Repeated falls: Secondary | ICD-10-CM | POA: Insufficient documentation

## 2022-08-04 ENCOUNTER — Telehealth: Payer: Self-pay

## 2022-08-04 NOTE — Telephone Encounter (Signed)
Transition Care Management Follow-up Telephone Call Date of discharge and from where: [facility] 08/02/2022 How have you been since you were released from the hospital? better Any questions or concerns? No  Items Reviewed: Did the pt receive and understand the discharge instructions provided? Yes  Medications obtained and verified? Yes  Other? No  Any new allergies since your discharge? No  Dietary orders reviewed? Yes Do you have support at home? Yes   Home Care and Equipment/Supplies: Were home health services ordered? no If so, what is the name of the agency? N/a  Has the agency set up a time to come to the patient's home? no Were any new equipment or medical supplies ordered?  No What is the name of the medical supply agency? N/a Were you able to get the supplies/equipment? not applicable Do you have any questions related to the use of the equipment or supplies? No  Functional Questionnaire: (I = Independent and D = Dependent) ADLs: I  Bathing/Dressing- I  Meal Prep- I  Eating- I  Maintaining continence- I  Transferring/Ambulation- I  Managing Meds- I  Follow up appointments reviewed:  PCP Hospital f/u appt confirmed? Yes  Scheduled to see Dr Zigmund Daniel on 08/21/2022 @ 10:10. [facility] f/u appt confirmed? No  Are transportation arrangements needed? No  If their condition worsens, is the pt aware to call PCP or go to the Emergency Dept.? Yes Was the patient provided with contact information for the PCP's office or ED? Yes Was to pt encouraged to call back with questions or concerns? Yes  Juanda Crumble, LPN [facility] [phone]

## 2022-08-06 ENCOUNTER — Other Ambulatory Visit: Payer: Self-pay | Admitting: Family Medicine

## 2022-08-21 ENCOUNTER — Encounter: Payer: Self-pay | Admitting: Family Medicine

## 2022-08-21 ENCOUNTER — Ambulatory Visit (INDEPENDENT_AMBULATORY_CARE_PROVIDER_SITE_OTHER): Payer: Commercial Managed Care - HMO | Admitting: Family Medicine

## 2022-08-21 VITALS — BP 105/61 | HR 80 | Ht 69.0 in | Wt 195.0 lb

## 2022-08-21 DIAGNOSIS — J9611 Chronic respiratory failure with hypoxia: Secondary | ICD-10-CM

## 2022-08-21 DIAGNOSIS — J441 Chronic obstructive pulmonary disease with (acute) exacerbation: Secondary | ICD-10-CM

## 2022-08-21 DIAGNOSIS — I10 Essential (primary) hypertension: Secondary | ICD-10-CM

## 2022-08-21 MED ORDER — PREDNISONE 50 MG PO TABS: ORAL_TABLET | ORAL | 0 refills | Status: DC

## 2022-08-21 MED ORDER — AMLODIPINE BESYLATE 5 MG PO TABS: 7.5000 mg | ORAL_TABLET | Freq: Every day | ORAL | 1 refills | Status: AC

## 2022-08-21 NOTE — Patient Instructions (Signed)
Start prednisone. Let me know if not improving.  Continue amlodipine at 7.5mg  daily.

## 2022-08-21 NOTE — Progress Notes (Signed)
Ronnie Bowman - 67 y.o. male MRN [id number]  Date of birth: 1956/04/15  Subjective Chief Complaint  Patient presents with   Hypertension    HPI Ronnie Bowman is a 67 y.o. male here today for follow up visit.   Recently hospitalized due to hypoxia, recurrent falls and hypotension.  Tested positive for influenza A.  Treated with Tamiflu and prednisone.  Lasix was reduced due to hypotension.  He does remain hypoxic which is related to his chronic respiratory failure.  He does have home oxygen which she uses intermittently at home.  He is unable to use this while at work and does not usually bring this with him when he travels.  He has noted a little wheezing recently.  Mild dyspnea.  Denies fever or chills.  His blood pressures been a little more elevated at home.  He has restarted amlodipine at 7.5 mg daily.  He is also added furosemide back on.  He denies chest pain, headache or vision changes.  ROS:  A comprehensive ROS was completed and negative except as noted per HPI  No Known Allergies  Past Medical History:  Diagnosis Date   Acute pulmonary embolism (HCC)    CHF (congestive heart failure) ([place])    Community acquired pneumonia of left lung 04/15/2018   COPD (chronic obstructive pulmonary disease) ([place])    Hypertension     History reviewed. No pertinent surgical history.  Social History   Socioeconomic History   Marital status: Single    Spouse name: Not on file   Number of children: Not on file   Years of education: Not on file   Highest education level: Not on file  Occupational History   Not on file  Tobacco Use   Smoking status: Former    Packs/day: 2.00    Years: 45.00    Total pack years: 90.00    Types: Cigarettes    Quit date: 11/01/2017    Years since quitting: 4.8   Smokeless tobacco: Never  Vaping Use   Vaping Use: Former  Substance and Sexual Activity   Alcohol use: Yes    Alcohol/week: 3.0 standard drinks of alcohol    Types: 3 Standard drinks or  equivalent per week    Comment: 5th a week   Drug use: Never   Sexual activity: Yes    Birth control/protection: None  Other Topics Concern   Not on file  Social History Narrative   Not on file   Social Determinants of Health   Financial Resource Strain: Not on file  Food Insecurity: Not on file  Transportation Needs: Not on file  Physical Activity: Not on file  Stress: Not on file  Social Connections: Not on file    Family History  Problem Relation Age of Onset   Leukemia Mother    Heart failure Father     Health Maintenance  Topic Date Due   DTaP/Tdap/Td (2 - Td or Tdap) 12/25/2019   Lung Cancer Screening  09/03/2022 (Originally 06/14/2008)   INFLUENZA VACCINE  11/01/2022 (Originally 03/03/2022)   Zoster Vaccines- Shingrix (1 of 2) 12/04/2022 (Originally 06/14/2008)   COLONOSCOPY (Pts 45-73yrs Insurance coverage will need to be confirmed)  03/28/2023 (Originally 06/15/2003)   Hepatitis C Screening  Completed   HIV Screening  Completed   HPV VACCINES  Aged Out   COVID-19 Vaccine  Discontinued     ----------------------------------------------------------------------------------------------------------------------------------------------------------------------------------------------------------------- Physical Exam BP 105/61 (BP Location: Left Arm, Patient Position: Sitting, Cuff Size: Normal)   Pulse 80  Ht 5\' 9"  (1.753 m)   Wt 195 lb (88.5 kg)   SpO2 (!) 83% Comment: Patient declines O2  BMI 28.80 kg/m   Physical Exam HENT:     Head: Normocephalic and atraumatic.  Eyes:     General: No scleral icterus. Cardiovascular:     Rate and Rhythm: Normal rate and regular rhythm.  Pulmonary:     Effort: Pulmonary effort is normal.     Breath sounds: Wheezing present.  Neurological:     General: No focal deficit present.     Mental Status: He is alert.  Psychiatric:        Mood and Affect: Mood normal.        Behavior: Behavior normal.      ------------------------------------------------------------------------------------------------------------------------------------------------------------------------------------------------------------------- Assessment and Plan  COPD with exacerbation ([place]) He is having some increased wheezing on exam today.  Adding course of prednisone.  Continue current inhalers.  Encouraged to use oxygen is much as possible.  He will let me know if this is worsening  Hypertension goal BP (blood pressure) < 130/80 Blood pressure is well-controlled at this time.  Denies symptoms of hypotension at this time.  He will continue amlodipine at 7.5 mg daily with furosemide as needed.   Meds ordered this encounter  Medications   amLODipine (NORVASC) 5 MG tablet    Sig: Take 1.5 tablets (7.5 mg total) by mouth daily.    Dispense:  135 tablet    Refill:  1   predniSONE (DELTASONE) 50 MG tablet    Sig: Take 1 tab po daily x5 days.    Dispense:  5 tablet    Refill:  0    No follow-ups on file.    This visit occurred during the SARS-CoV-2 public health emergency.  Safety protocols were in place, including screening questions prior to the visit, additional usage of staff PPE, and extensive cleaning of exam room while observing appropriate contact time as indicated for disinfecting solutions.

## 2022-08-22 LAB — CBC WITH DIFFERENTIAL/PLATELET
Absolute Monocytes: 657 cells/uL (ref 200–950)
Basophils Absolute: 72 cells/uL (ref 0–200)
Basophils Relative: 1.1 %
Eosinophils Absolute: 267 cells/uL (ref 15–500)
Eosinophils Relative: 4.1 %
HCT: 46.6 % (ref 38.5–50.0)
Hemoglobin: 16.4 g/dL (ref 13.2–17.1)
Lymphs Abs: 1404 cells/uL (ref 850–3900)
MCH: 34.2 pg — ABNORMAL HIGH (ref 27.0–33.0)
MCHC: 35.2 g/dL (ref 32.0–36.0)
MCV: 97.3 fL (ref 80.0–100.0)
MPV: 10.9 fL (ref 7.5–12.5)
Monocytes Relative: 10.1 %
Neutro Abs: 4102 cells/uL (ref 1500–7800)
Neutrophils Relative %: 63.1 %
Platelets: 243 10*3/uL (ref 140–400)
RBC: 4.79 10*6/uL (ref 4.20–5.80)
RDW: 13.4 % (ref 11.0–15.0)
Total Lymphocyte: 21.6 %
WBC: 6.5 10*3/uL (ref 3.8–10.8)

## 2022-08-22 LAB — COMPLETE METABOLIC PANEL WITH GFR
AG Ratio: 0.7 (calc) — ABNORMAL LOW (ref 1.0–2.5)
ALT: 12 U/L (ref 9–46)
AST: 10 U/L (ref 10–35)
Albumin: 3.1 g/dL — ABNORMAL LOW (ref 3.6–5.1)
Alkaline phosphatase (APISO): 82 U/L (ref 35–144)
BUN: 14 mg/dL (ref 7–25)
CO2: 30 mmol/L (ref 20–32)
Calcium: 8.9 mg/dL (ref 8.6–10.3)
Chloride: 100 mmol/L (ref 98–110)
Creat: 1.27 mg/dL (ref 0.70–1.35)
Globulin: 4.4 g/dL (calc) — ABNORMAL HIGH (ref 1.9–3.7)
Glucose, Bld: 71 mg/dL (ref 65–99)
Potassium: 4.5 mmol/L (ref 3.5–5.3)
Sodium: 137 mmol/L (ref 135–146)
Total Bilirubin: 0.8 mg/dL (ref 0.2–1.2)
Total Protein: 7.5 g/dL (ref 6.1–8.1)
eGFR: 63 mL/min/{1.73_m2} (ref >=60)

## 2022-08-23 NOTE — Assessment & Plan Note (Signed)
Blood pressure is well-controlled at this time.  Denies symptoms of hypotension at this time.  He will continue amlodipine at 7.5 mg daily with furosemide as needed.

## 2022-08-23 NOTE — Assessment & Plan Note (Signed)
He is having some increased wheezing on exam today.  Adding course of prednisone.  Continue current inhalers.  Encouraged to use oxygen is much as possible.  He will let me know if this is worsening

## 2022-09-02 ENCOUNTER — Other Ambulatory Visit: Payer: Self-pay

## 2022-09-02 ENCOUNTER — Other Ambulatory Visit: Payer: Self-pay | Admitting: Family Medicine

## 2022-09-02 ENCOUNTER — Telehealth: Payer: Self-pay

## 2022-09-02 MED ORDER — COLCHICINE 0.6 MG PO TABS: ORAL_TABLET | ORAL | 0 refills | Status: AC

## 2022-09-02 NOTE — Telephone Encounter (Signed)
Pt is requesting an everyday medication for gout symptoms. States colchicine works for flares but he's been having them more regularly.

## 2022-09-03 NOTE — Telephone Encounter (Signed)
Let's have him schedule to discuss and check labs to see what his uric acid levels are.   CM

## 2022-09-04 ENCOUNTER — Other Ambulatory Visit: Payer: Self-pay

## 2022-09-04 DIAGNOSIS — I10 Essential (primary) hypertension: Secondary | ICD-10-CM

## 2022-09-04 MED ORDER — LISINOPRIL 40 MG PO TABS: 40.0000 mg | ORAL_TABLET | Freq: Every day | ORAL | 0 refills | Status: AC

## 2022-09-04 NOTE — Telephone Encounter (Signed)
Pt stated that the soonest that he could get in was on Friday's. Got pt scheduled for 09/25/2022 @ 9:20. Tvt

## 2022-09-15 ENCOUNTER — Other Ambulatory Visit: Payer: Self-pay | Admitting: Family Medicine

## 2022-09-15 DIAGNOSIS — I10 Essential (primary) hypertension: Secondary | ICD-10-CM

## 2022-09-23 ENCOUNTER — Other Ambulatory Visit: Payer: Self-pay | Admitting: Family Medicine

## 2022-09-25 ENCOUNTER — Ambulatory Visit (INDEPENDENT_AMBULATORY_CARE_PROVIDER_SITE_OTHER): Payer: Commercial Managed Care - HMO | Admitting: Family Medicine

## 2022-09-25 ENCOUNTER — Other Ambulatory Visit: Payer: Self-pay

## 2022-09-25 VITALS — BP 113/69 | HR 54 | Ht 69.0 in | Wt 204.0 lb

## 2022-09-25 DIAGNOSIS — M109 Gout, unspecified: Secondary | ICD-10-CM

## 2022-09-25 DIAGNOSIS — J449 Chronic obstructive pulmonary disease, unspecified: Secondary | ICD-10-CM | POA: Diagnosis not present

## 2022-09-25 MED ORDER — MOMETASONE FURO-FORMOTEROL FUM 100-5 MCG/ACT IN AERO: 2.0000 | INHALATION_SPRAY | Freq: Two times a day (BID) | RESPIRATORY_TRACT | 3 refills | Status: AC

## 2022-09-25 NOTE — Progress Notes (Unsigned)
Ronnie Bowman - 67 y.o. male MRN [id number]  Date of birth: 29-Jul-1956  Subjective Chief Complaint  Patient presents with   Gout    HPI Ronnie Bowman is a 67 year old male here today for follow-up visit.  He is concerned about recurrent episodes of gout.  He would like to have medication to prevent recurrence of this.  He has not had recent uric acid levels.  He has not really made any changes to his diet.  Only uses O2 at home intermittently.  Unable to use this at work.  Has considered filing for disability however states that he cannot make it on this alone.  He is not using daily controller inhaler.  Several medications have been too costly for him.  Had Dulera at one point but has not been using.  Look into restarting this.  ROS:  A comprehensive ROS was completed and negative except as noted per HPI  No Known Allergies  Past Medical History:  Diagnosis Date   Acute pulmonary embolism (HCC)    CHF (congestive heart failure) ([place])    Community acquired pneumonia of left lung 04/15/2018   COPD (chronic obstructive pulmonary disease) ([place])    Hypertension     No past surgical history on file.  Social History   Socioeconomic History   Marital status: Single    Spouse name: Not on file   Number of children: Not on file   Years of education: Not on file   Highest education level: Not on file  Occupational History   Not on file  Tobacco Use   Smoking status: Former    Packs/day: 2.00    Years: 45.00    Total pack years: 90.00    Types: Cigarettes    Quit date: 11/01/2017    Years since quitting: 4.9   Smokeless tobacco: Never  Vaping Use   Vaping Use: Former  Substance and Sexual Activity   Alcohol use: Yes    Alcohol/week: 3.0 standard drinks of alcohol    Types: 3 Standard drinks or equivalent per week    Comment: 5th a week   Drug use: Never   Sexual activity: Yes    Birth control/protection: None  Other Topics Concern   Not on file  Social History Narrative   Not  on file   Social Determinants of Health   Financial Resource Strain: Not on file  Food Insecurity: Not on file  Transportation Needs: Not on file  Physical Activity: Not on file  Stress: Not on file  Social Connections: Not on file    Family History  Problem Relation Age of Onset   Leukemia Mother    Heart failure Father     Health Maintenance  Topic Date Due   DTaP/Tdap/Td (2 - Td or Tdap) 12/25/2019   INFLUENZA VACCINE  11/01/2022 (Originally 03/03/2022)   Zoster Vaccines- Shingrix (1 of 2) 12/04/2022 (Originally 06/14/2008)   COLONOSCOPY (Pts 45-9yr Insurance coverage will need to be confirmed)  03/28/2023 (Originally 06/15/2003)   Lung Cancer Screening  12/02/2023 (Originally 06/14/2008)   Hepatitis C Screening  Completed   HIV Screening  Completed   HPV VACCINES  Aged Out   COVID-19 Vaccine  Discontinued     ----------------------------------------------------------------------------------------------------------------------------------------------------------------------------------------------------------------- Physical Exam BP 113/69 (BP Location: Right Arm, Patient Position: Sitting, Cuff Size: Normal)   Pulse (!) 54   Ht '5\' 9"'$  (1.753 m)   Wt 204 lb (92.5 kg)   SpO2 90%   BMI 30.13 kg/m   Physical Exam Constitutional:  Appearance: Normal appearance.  HENT:     Head: Normocephalic and atraumatic.  Eyes:     General: No scleral icterus. Cardiovascular:     Rate and Rhythm: Normal rate and regular rhythm.  Pulmonary:     Effort: Pulmonary effort is normal.     Breath sounds: Normal breath sounds.  Neurological:     Mental Status: He is alert.  Psychiatric:        Mood and Affect: Mood normal.        Behavior: Behavior normal.      ------------------------------------------------------------------------------------------------------------------------------------------------------------------------------------------------------------------- Assessment and Plan  Chronic obstructive pulmonary disease ([place]) He will use his oxygen while at home and this is intermittent.  Recommend using this is much as possible due to chronic respiratory failure and hypoxemia.  We discussed that use of controller inhaler would likely benefit him as well.  He will look at restarting Dulera.  Gout He has had recurrent gout flares.  Checking uric acid levels.   No orders of the defined types were placed in this encounter.   No follow-ups on file.    This visit occurred during the SARS-CoV-2 public health emergency.  Safety protocols were in place, including screening questions prior to the visit, additional usage of staff PPE, and extensive cleaning of exam room while observing appropriate contact time as indicated for disinfecting solutions.

## 2022-09-25 NOTE — Patient Instructions (Signed)
We'll be in touch with your uric acid results.  Try adding dulera back on-Use this every day.

## 2022-09-26 LAB — URIC ACID: Uric Acid, Serum: 11.8 mg/dL — ABNORMAL HIGH (ref 4.0–8.0)

## 2022-09-27 ENCOUNTER — Encounter: Payer: Self-pay | Admitting: Family Medicine

## 2022-09-27 DIAGNOSIS — M109 Gout, unspecified: Secondary | ICD-10-CM | POA: Insufficient documentation

## 2022-09-27 NOTE — Assessment & Plan Note (Signed)
He has had recurrent gout flares.  Checking uric acid levels.

## 2022-09-27 NOTE — Assessment & Plan Note (Signed)
He will use his oxygen while at home and this is intermittent.  Recommend using this is much as possible due to chronic respiratory failure and hypoxemia.  We discussed that use of controller inhaler would likely benefit him as well.  He will look at restarting Dulera.

## 2022-10-02 DEATH — deceased
